# Patient Record
Sex: Female | Born: 1950
Health system: Southern US, Community
[De-identification: ages and names within clinical notes are randomized; demographics above are authoritative.]

## PROBLEM LIST (undated history)

## (undated) DIAGNOSIS — K314 Gastric diverticulum: Secondary | ICD-10-CM

## (undated) DIAGNOSIS — M1990 Unspecified osteoarthritis, unspecified site: Secondary | ICD-10-CM

## (undated) DIAGNOSIS — F419 Anxiety disorder, unspecified: Secondary | ICD-10-CM

## (undated) DIAGNOSIS — L409 Psoriasis, unspecified: Secondary | ICD-10-CM

## (undated) DIAGNOSIS — G4730 Sleep apnea, unspecified: Secondary | ICD-10-CM

## (undated) HISTORY — PX: THUMB ARTHROSCOPY: SHX2509

## (undated) HISTORY — PX: CHOLECYSTECTOMY: SHX55

## (undated) HISTORY — PX: BREAST BIOPSY: SHX20

## (undated) HISTORY — PX: ABDOMINAL HYSTERECTOMY: SHX81

## (undated) HISTORY — PX: BACK SURGERY: SHX140

## (undated) HISTORY — PX: MECKEL DIVERTICULUM EXCISION: SHX314

---

## 2002-08-17 ENCOUNTER — Encounter: Payer: Self-pay | Admitting: Neurosurgery

## 2002-08-17 ENCOUNTER — Inpatient Hospital Stay (HOSPITAL_COMMUNITY): Admission: RE | Admit: 2002-08-17 | Discharge: 2002-08-18 | Payer: Self-pay | Admitting: Neurosurgery

## 2004-05-20 ENCOUNTER — Ambulatory Visit: Payer: Self-pay | Admitting: Unknown Physician Specialty

## 2004-06-07 ENCOUNTER — Ambulatory Visit: Payer: Self-pay | Admitting: Internal Medicine

## 2004-10-09 ENCOUNTER — Encounter: Payer: Self-pay | Admitting: Rheumatology

## 2004-10-10 ENCOUNTER — Encounter: Payer: Self-pay | Admitting: Rheumatology

## 2005-01-07 ENCOUNTER — Ambulatory Visit: Payer: Self-pay | Admitting: Internal Medicine

## 2006-10-23 ENCOUNTER — Ambulatory Visit: Payer: Self-pay | Admitting: Internal Medicine

## 2006-11-04 ENCOUNTER — Ambulatory Visit: Payer: Self-pay | Admitting: Internal Medicine

## 2007-01-01 ENCOUNTER — Ambulatory Visit: Payer: Self-pay | Admitting: Surgery

## 2007-01-01 ENCOUNTER — Other Ambulatory Visit: Payer: Self-pay

## 2007-01-08 ENCOUNTER — Ambulatory Visit: Payer: Self-pay | Admitting: Surgery

## 2007-03-30 ENCOUNTER — Ambulatory Visit: Payer: Self-pay | Admitting: Internal Medicine

## 2007-05-10 ENCOUNTER — Ambulatory Visit: Payer: Self-pay | Admitting: Unknown Physician Specialty

## 2008-08-15 ENCOUNTER — Ambulatory Visit: Payer: Self-pay | Admitting: Internal Medicine

## 2008-08-25 ENCOUNTER — Encounter: Admission: RE | Admit: 2008-08-25 | Discharge: 2008-08-25 | Payer: Self-pay | Admitting: Occupational Medicine

## 2009-04-10 ENCOUNTER — Ambulatory Visit: Payer: Self-pay | Admitting: Orthopedic Surgery

## 2010-06-02 ENCOUNTER — Encounter: Payer: Self-pay | Admitting: Neurosurgery

## 2010-09-27 NOTE — Op Note (Signed)
[name], [name]                           ACCOUNT NO.:  [id number]   MEDICAL RECORD NO.:  [id number]                   PATIENT TYPE:  INP   LOCATION:  2899                                 FACILITY:  [facility]   PHYSICIAN:  Hewitt Shorts, M.D.            DATE OF BIRTH:  [birth year]   DATE OF PROCEDURE:  08/17/2002  DATE OF DISCHARGE:                                 OPERATIVE REPORT   PREOPERATIVE DIAGNOSIS:  C4-5, C5-6 and C6-7 cervical disk herniation,  spondylosis and degenerative disk disease and radiculopathy.   POSTOPERATIVE DIAGNOSIS:  C4-5, C5-6 and C6-7 cervical disk herniation,  spondylosis and degenerative disk disease and radiculopathy.   OPERATION PERFORMED:  C4-5, C5-6 and C6-7 anterior cervical diskectomy and  arthrodesis with VG2 allograft and Premier cervical plating.   SURGEON:  Hewitt Shorts, M.D.   ANESTHESIA:  General endotracheal.   INDICATIONS FOR PROCEDURE:  The patient is a 59 year old woman who presented  with cervical radiculopathy.  He is found to have spondylitic disk  herniations at C4-5, C5-6 and C6-7 superimposed on degenerative disk disease  with resulting radiculopathy.  Decision was made to proceed with three level  anterior cervical diskectomy and arthrodesis.   DESCRIPTION OF PROCEDURE:  The patient was brought to the operating room and  placed under general endotracheal anesthesia.  The patient placed in 10  pounds of halter traction and the neck was prepped with Betadine soap and  solution and draped in a sterile fashion.  An oblique incision was made on  the left side of the neck paralleling the anterior border of the left  sternocleidomastoid.  The line of incision was infiltrated with local  anesthetic with epinephrine.  Incision made and carried down to the  subcutaneous tissue and platysma.  Bipolar cautery was used to maintain  hemostasis.  Dissection was carried out to an avascular plane leaving the  sternocleidomastoid,  carotid artery and jugular vein laterally and trachea  and esophagus medially.  The ventral aspect of the vertebral column was  identified and an x-ray was taken and the C4-5, C5-6 and C6-7 intervertebral  disk spaces were identified.  Diskectomy was begun at each level with  incision of the annulus and continued with microcurets and pituitary  rongeurs.  There was significant anterior osteophytic overgrowth that was  removed.  The operating microscope was draped and brought into the field to  provide additional magnification, illumination and visualization and the  remainder of the diskectomy was performed using microdissection and  microsurgical technique. The cartilaginous end plates and corresponding  vertebrae were removed using microcurets along with the Micromax drill and  then posterior osteophytic overgrowth which was present at each level was  carefully removed using the Micromax drill along with the 2 mm Kerrison  punch with a thin foot plate.  The posterior longitudinal ligament was  spondylitic at each level, thickened and partially calcified and  this was  carefully removed, thereby decompressing the spinal canal and thecal sac.  The foramina were decompressed bilaterally at each level with careful  removal of uncovertebral joint spurring.  We were able to decompress the  foramina and nerve roots within them.  Once the decompression was completed,  hemostasis was established at each level, we soaked VG2 allograft in  antibiotic solution.  We placed 4 mm grafts in the C4-5 and C5-6 levels and  a 5 mm graft at the C6-7 level.  Each graft was carefully countersunk.  We  then selected a 52.5 mm Premier cervical plate.  It was positioned over the  fusion construct and secured to each of the vertebrae with a pair of 4 x 13  mm screws.  Each screw hole was drilled and the screws placed at each level.  They were placed in trapezoidal fashion.  The locking system was secured.  X-  ray  was taken which showed the grafts in good position, the plate and screws  in good position and alignment was good.  The wound was irrigated with  antibiotic solution, checked for hemostasis which was established and  confirmed and then we proceeded with closure.  The platysma was closed with  interrupted inverted 2-0 undyed Vicryl sutures and the subcutaneous and  subcuticular layer closed in 3-0 undyed Vicryl sutures and the skin edges  were approximated with Dermabond.  The patient tolerated the procedure well.  The estimated blood loss for this procedure was 100 ml.  Sponge, needle and  instrument counts were correct.  Following surgery, the patient, who had  been taken out of cervical traction once the bone grafts were in place prior  to the plating was then reversed from anesthetic to be extubated and was  transferred to the recovery room for further care.  Where she was noted to  be moving all four extremities to command.                                                Hewitt Shorts, M.D.    RWN/MEDQ  D:  08/17/2002  T:  08/18/2002  Job:  [id number]

## 2010-10-18 ENCOUNTER — Ambulatory Visit: Payer: Self-pay | Admitting: Internal Medicine

## 2010-10-31 ENCOUNTER — Ambulatory Visit: Payer: Self-pay | Admitting: Internal Medicine

## 2010-11-08 ENCOUNTER — Ambulatory Visit: Payer: Self-pay | Admitting: Internal Medicine

## 2010-11-08 ENCOUNTER — Other Ambulatory Visit: Payer: Self-pay | Admitting: Unknown Physician Specialty

## 2010-11-08 DIAGNOSIS — Z1211 Encounter for screening for malignant neoplasm of colon: Secondary | ICD-10-CM

## 2010-11-25 ENCOUNTER — Ambulatory Visit
Admission: RE | Admit: 2010-11-25 | Discharge: 2010-11-25 | Disposition: A | Discharge status: Home / Self Care | Payer: BC Managed Care – PPO | Source: Ambulatory Visit | Attending: *Deleted | Admitting: *Deleted

## 2010-11-25 DIAGNOSIS — Z1211 Encounter for screening for malignant neoplasm of colon: Secondary | ICD-10-CM

## 2011-02-01 ENCOUNTER — Ambulatory Visit: Payer: Self-pay | Admitting: Orthopedic Surgery

## 2013-05-30 ENCOUNTER — Ambulatory Visit: Payer: Self-pay | Admitting: Internal Medicine

## 2013-11-17 DIAGNOSIS — M1990 Unspecified osteoarthritis, unspecified site: Secondary | ICD-10-CM | POA: Insufficient documentation

## 2014-07-24 ENCOUNTER — Ambulatory Visit: Payer: Self-pay | Admitting: Internal Medicine

## 2014-08-08 ENCOUNTER — Ambulatory Visit: Payer: Self-pay | Admitting: Physical Medicine and Rehabilitation

## 2015-06-28 ENCOUNTER — Other Ambulatory Visit: Payer: Self-pay | Admitting: Internal Medicine

## 2015-06-28 DIAGNOSIS — R221 Localized swelling, mass and lump, neck: Secondary | ICD-10-CM

## 2015-07-06 ENCOUNTER — Ambulatory Visit: Payer: BLUE CROSS/BLUE SHIELD

## 2015-11-26 DIAGNOSIS — Z1329 Encounter for screening for other suspected endocrine disorder: Secondary | ICD-10-CM | POA: Diagnosis not present

## 2015-11-26 DIAGNOSIS — F3341 Major depressive disorder, recurrent, in partial remission: Secondary | ICD-10-CM | POA: Diagnosis not present

## 2015-11-26 DIAGNOSIS — Z1239 Encounter for other screening for malignant neoplasm of breast: Secondary | ICD-10-CM | POA: Diagnosis not present

## 2015-11-26 DIAGNOSIS — Z1211 Encounter for screening for malignant neoplasm of colon: Secondary | ICD-10-CM | POA: Diagnosis not present

## 2015-11-26 DIAGNOSIS — J431 Panlobular emphysema: Secondary | ICD-10-CM | POA: Diagnosis not present

## 2015-11-26 DIAGNOSIS — Z79899 Other long term (current) drug therapy: Secondary | ICD-10-CM | POA: Diagnosis not present

## 2015-11-26 DIAGNOSIS — M810 Age-related osteoporosis without current pathological fracture: Secondary | ICD-10-CM | POA: Diagnosis not present

## 2015-11-26 DIAGNOSIS — Z13220 Encounter for screening for lipoid disorders: Secondary | ICD-10-CM | POA: Diagnosis not present

## 2015-11-26 DIAGNOSIS — Z0000 Encounter for general adult medical examination without abnormal findings: Secondary | ICD-10-CM | POA: Diagnosis not present

## 2015-11-30 ENCOUNTER — Other Ambulatory Visit: Payer: Self-pay | Admitting: Internal Medicine

## 2015-11-30 DIAGNOSIS — Z1231 Encounter for screening mammogram for malignant neoplasm of breast: Secondary | ICD-10-CM

## 2015-12-25 DIAGNOSIS — Z1329 Encounter for screening for other suspected endocrine disorder: Secondary | ICD-10-CM | POA: Diagnosis not present

## 2015-12-25 DIAGNOSIS — Z13220 Encounter for screening for lipoid disorders: Secondary | ICD-10-CM | POA: Diagnosis not present

## 2015-12-25 DIAGNOSIS — Z79899 Other long term (current) drug therapy: Secondary | ICD-10-CM | POA: Diagnosis not present

## 2016-02-06 DIAGNOSIS — Z23 Encounter for immunization: Secondary | ICD-10-CM | POA: Diagnosis not present

## 2016-02-08 ENCOUNTER — Ambulatory Visit
Admission: RE | Admit: 2016-02-08 | Discharge: 2016-02-08 | Disposition: A | Discharge status: Home / Self Care | Payer: PPO | Source: Ambulatory Visit | Attending: Internal Medicine | Admitting: Internal Medicine

## 2016-02-08 DIAGNOSIS — M810 Age-related osteoporosis without current pathological fracture: Secondary | ICD-10-CM | POA: Diagnosis not present

## 2016-02-08 DIAGNOSIS — Z1231 Encounter for screening mammogram for malignant neoplasm of breast: Secondary | ICD-10-CM

## 2016-02-08 DIAGNOSIS — Z1211 Encounter for screening for malignant neoplasm of colon: Secondary | ICD-10-CM | POA: Diagnosis not present

## 2016-02-12 ENCOUNTER — Other Ambulatory Visit: Payer: Self-pay | Admitting: Unknown Physician Specialty

## 2016-02-12 DIAGNOSIS — Q438 Other specified congenital malformations of intestine: Secondary | ICD-10-CM

## 2016-02-22 ENCOUNTER — Other Ambulatory Visit: Payer: PPO

## 2016-02-25 ENCOUNTER — Ambulatory Visit
Admission: RE | Admit: 2016-02-25 | Discharge: 2016-02-25 | Disposition: A | Discharge status: Home / Self Care | Payer: PPO | Source: Ambulatory Visit | Attending: Unknown Physician Specialty | Admitting: Unknown Physician Specialty

## 2016-02-25 DIAGNOSIS — K5732 Diverticulitis of large intestine without perforation or abscess without bleeding: Secondary | ICD-10-CM | POA: Diagnosis not present

## 2016-02-25 DIAGNOSIS — Q438 Other specified congenital malformations of intestine: Secondary | ICD-10-CM

## 2016-06-09 DIAGNOSIS — M7741 Metatarsalgia, right foot: Secondary | ICD-10-CM | POA: Diagnosis not present

## 2016-06-09 DIAGNOSIS — M7742 Metatarsalgia, left foot: Secondary | ICD-10-CM | POA: Diagnosis not present

## 2016-06-09 DIAGNOSIS — M65312 Trigger thumb, left thumb: Secondary | ICD-10-CM | POA: Diagnosis not present

## 2016-06-23 ENCOUNTER — Ambulatory Visit: Payer: PPO

## 2016-06-23 ENCOUNTER — Encounter: Payer: Self-pay | Admitting: Podiatry

## 2016-06-23 ENCOUNTER — Ambulatory Visit (INDEPENDENT_AMBULATORY_CARE_PROVIDER_SITE_OTHER): Payer: PPO | Admitting: Podiatry

## 2016-06-23 VITALS — BP 150/78 | HR 84 | Resp 16 | Ht 65.0 in | Wt 155.0 lb

## 2016-06-23 DIAGNOSIS — D3610 Benign neoplasm of peripheral nerves and autonomic nervous system, unspecified: Secondary | ICD-10-CM | POA: Diagnosis not present

## 2016-06-23 DIAGNOSIS — M79672 Pain in left foot: Secondary | ICD-10-CM

## 2016-06-23 DIAGNOSIS — M779 Enthesopathy, unspecified: Secondary | ICD-10-CM | POA: Diagnosis not present

## 2016-06-23 NOTE — Progress Notes (Signed)
   Subjective:    Patient ID: [name], female    DOB: 30-Dec-1950, 66 y.o.   MRN: [id number]  HPI Chief Complaint  Patient presents with  . Foot Injury    Right foot; Right foot; lateral side & plantar forefoot; pt stated, "About 8 months ago, fell on foot when walked down the steps and twisted foot; can feel popping sound in ball of foot; sometimes feels a burning sensation in ball of foot; pain is on and off"      Review of Systems  All other systems reviewed and are negative.      Objective:   Physical Exam        Assessment & Plan:

## 2016-06-23 NOTE — Patient Instructions (Signed)
Morton Neuralgia Introduction Morton neuralgia is a type of foot pain in the area closest to your toes. This area is sometimes called the ball of your foot. Morton neuralgia occurs when a branch of a nerve in your foot (digital nerve) becomes compressed. When this happens over a long period of time, the nerve can thicken (neuroma) and cause pain. This usually occurs between the third and fourth toe. Morton neuralgia can come and go but may get worse over time. What are the causes? Your digital nerve can become compressed and stretched at a point where it passes under a thick band of tissue that connects your toes (intermetatarsal ligament). Morton neuralgia can be caused by mild repetitive damage in this area. This type of damage can result from:  Activities such as running or jumping.  Wearing shoes that are too tight. What increases the risk? You may be at risk for Morton neuralgia if you:  Are female.  Wear high heels.  Wear shoes that are narrow or tight.  Participate in activities that stretch your toes. These include:  Running.  Ballet.  Long-distance walking. What are the signs or symptoms? The first symptom of Morton neuralgia is pain that spreads from the ball of your foot to your toes. It may feel like you are walking on a marble. Pain usually gets worse with walking and goes away at night. Other symptoms may include numbness and cramping of your toes. How is this diagnosed? Your health care provider will do a physical exam. When doing the exam, your health care provider may:  Squeeze your foot just behind your toe.  Ask you to move your toes to check for pain. You may also have tests on your foot to confirm the diagnosis. These may include:  An X-ray.  An MRI. How is this treated? Treatment for Morton neuralgia may be as simple as changing the kind of shoes you wear. Other treatments may include:  Wearing a supportive pad (orthosis) under the front of your foot.  This lifts your toe bones and takes pressure off the nerve.  Getting injections of numbing medicine and anti-inflammatory medicine (steroid) in the nerve.  Having surgery to remove part of the thickened nerve. Follow these instructions at home:  Take medicine only as directed by your health care provider.  Wear soft-soled shoes with a wide toe area.  Stop activities that may be causing pain.  Elevate your foot when resting.  Massage your foot.  Apply ice to the injured area:  Put ice in a plastic bag.  Place a towel between your skin and the bag.  Leave the ice on for 20 minutes, 2-3 times a day.  Keep all follow-up visits as directed by your health care provider. This is important. Contact a health care provider if:  Home care instructions are not helping you get better.  Your symptoms change or get worse. This information is not intended to replace advice given to you by your health care provider. Make sure you discuss any questions you have with your health care provider. Document Released: 08/04/2000 Document Revised: 10/04/2015 Document Reviewed: 06/29/2013  2017 [employer]  

## 2016-06-25 NOTE — Progress Notes (Signed)
Subjective:     Patient ID: [name], female   DOB: 08/01/1950, 66 y.o.   MRN: [id number]  HPI patient states that she feels like she fell on her right foot twisted it and developed a burning pain in the forefoot with a feeling like there is a pop or a not in the area. Has had x-rays so far which are negative   Review of Systems  All other systems reviewed and are negative.      Objective:   Physical Exam  Constitutional: She is oriented to person, place, and time.  Cardiovascular: Intact distal pulses.   Musculoskeletal: Normal range of motion.  Neurological: She is oriented to person, place, and time.  Skin: Skin is warm.  Nursing note and vitals reviewed.  neurovascular status intact muscle strength adequate range of motion within normal limits with patient found to have what appears to be a positive nodule third interspace right with shooting pains and positive Mulder sign. Mild discomfort on the outside of the foot but no indications of bony stress or pathology. Good digital perfusion well oriented 3     Assessment:     Probability for neuroma symptomatology right    Plan:     H&P condition and explanation given to patient. Today I did a sterile prep and I injected directly into the nerve root prior to breaking into digital branches with a purified alcohol Marcaine solution for sclerosing of the nerve with the possibility for excision in future. Reappoint in 2 weeks to reevaluate

## 2016-07-09 ENCOUNTER — Encounter: Payer: Self-pay | Admitting: Podiatry

## 2016-07-09 ENCOUNTER — Ambulatory Visit (INDEPENDENT_AMBULATORY_CARE_PROVIDER_SITE_OTHER): Payer: PPO | Admitting: Podiatry

## 2016-07-09 DIAGNOSIS — D3610 Benign neoplasm of peripheral nerves and autonomic nervous system, unspecified: Secondary | ICD-10-CM | POA: Diagnosis not present

## 2016-07-09 NOTE — Progress Notes (Signed)
Subjective:     Patient ID: [name], female   DOB: 1950/07/28, 66 y.o.   MRN: [id number]  HPI patient states she has shooting pain in the third interspace right with some improvement but still present and had significant improvement the first day   Review of Systems     Objective:   Physical Exam Neurovascular status intact with patient found to have shooting pain third interspace right with positive Biagio Borg sign    Assessment:     Neuroma symptomatology right present    Plan:     Explained improvement and hopefully we will get continued improvement and today I did sterile prep of the right forefoot injected directly into the nerve root with a purified alcohol Marcaine solution and reappoint 3 weeks

## 2016-08-06 ENCOUNTER — Ambulatory Visit (INDEPENDENT_AMBULATORY_CARE_PROVIDER_SITE_OTHER): Payer: PPO | Admitting: Podiatry

## 2016-08-06 DIAGNOSIS — D3610 Benign neoplasm of peripheral nerves and autonomic nervous system, unspecified: Secondary | ICD-10-CM | POA: Diagnosis not present

## 2016-08-07 NOTE — Progress Notes (Signed)
Subjective:     Patient ID: [name], female   DOB: 09-28-1950, 66 y.o.   MRN: [id number]  HPI patient states she seems to be improving but still has a feeling of popping between the toes and occasional discomfort   Review of Systems     Objective:   Physical Exam Neurovascular status was intact with patient found have a positive Mulder sign third interspace right foot with a palpable mass with continued discomfort but quite a bit improved from previous    Assessment:     Neuroma symptomatology which appears to be responding at this time to sclerosing agent    Plan:     We will try 1 more sclerosing agent and understands eventually it still may require resection. I did a sterile prep of the area I did injected into the third interspace with a air 5 alcohol Marcaine solution 1.5 mL which was tolerated and sterile dressing applied. Patient will be seen back depending on symptoms

## 2016-09-03 DIAGNOSIS — R197 Diarrhea, unspecified: Secondary | ICD-10-CM | POA: Diagnosis not present

## 2016-10-20 DIAGNOSIS — M65312 Trigger thumb, left thumb: Secondary | ICD-10-CM | POA: Diagnosis not present

## 2016-10-30 ENCOUNTER — Other Ambulatory Visit: Payer: Self-pay | Admitting: Orthopedic Surgery

## 2016-10-30 DIAGNOSIS — M65312 Trigger thumb, left thumb: Secondary | ICD-10-CM | POA: Diagnosis not present

## 2016-11-04 DIAGNOSIS — L309 Dermatitis, unspecified: Secondary | ICD-10-CM | POA: Diagnosis not present

## 2016-11-07 ENCOUNTER — Encounter (HOSPITAL_BASED_OUTPATIENT_CLINIC_OR_DEPARTMENT_OTHER): Payer: Self-pay | Admitting: *Deleted

## 2016-11-13 NOTE — H&P (Addendum)
[name] is an 66 y.o. female.   CC / Reason for Visit: Left thumb trigger HPI: This patient is a 66 year old, right-hand-dominant, female who indicates that she has had a trigger thumb for 6-7 months which is much worse in the morning and actively becomes stuck in flexion.  She indicates that she has had 2 injections into the tendon sheath by Dr. Noemi Chapel with the last being in January of this year.  She went to see him again on 10/20/2016 and he recommended that she see [place] for discussion of left trigger thumb release.  She has had a history of bilateral TMC arthroplasties by Dr. Burney Gauze and denies any history of rheumatoid or autoimmune disorders.   Past Medical History:  Diagnosis Date  . Anxiety   . Arthritis   . Diverticulosis, gastric   . Psoriasis   . Sleep apnea    cpap    Past Surgical History:  Procedure Laterality Date  . ABDOMINAL HYSTERECTOMY    . BACK SURGERY     plate at c4  . BREAST BIOPSY Right 1990's   benign  . CESAREAN SECTION     x2  . CHOLECYSTECTOMY    . MECKEL DIVERTICULUM EXCISION    . THUMB ARTHROSCOPY      Family History  Problem Relation Age of Onset  . Breast cancer Mother 55   Social History:  reports that she has been smoking Cigarettes.  She has been smoking about 0.25 packs per day. She has never used smokeless tobacco. She reports that she does not drink alcohol or use drugs.  Allergies: No Known Allergies  No prescriptions prior to admission.    No results found for this or any previous visit (from the past 48 hour(s)). No results found.  Review of Systems  All other systems reviewed and are negative.   Height 5\' 5"  (1.651 m), weight 69.4 kg (153 lb). Physical Exam  Constitutional:  WD, WN, NAD HEENT:  NCAT, EOMI Neuro/Psych:  Alert & oriented to person, place, and time; appropriate mood & affect Lymphatic: No generalized UE edema or lymphadenopathy Extremities / MSK:  Both UE are normal with respect to appearance, ranges of  motion, joint stability, muscle strength/tone, sensation, & perfusion except as otherwise noted:  The left thumb is somewhat swollen at the volar base with painful palpation about the A1 pulley.  There is palpable catching through the flexion cycle.  The digit is able to flex completely at the IP although it is quite painful.  NVI.  Labs / Xrays:  No radiographic studies obtained today.  Assessment: Left thumb stenosing tenosynovitis  Plan:  The findings were discussed with the patient.  She wishes to move forward with left thumb trigger thumb release. The details of the operative procedure were discussed with the patient.  Questions were invited and answered.  In addition to the goal of the procedure, the risks of the procedure to include but not limited to bleeding; infection; damage to the nerves or blood vessels that could result in bleeding, numbness, weakness, chronic pain, and the need for additional procedures; stiffness; the need for revision surgery; and anesthetic risks were reviewed.  No specific outcome was guaranteed or implied.  Informed consent was obtained.   Raymund Manrique A., MD 11/13/2016, 5:37 PM

## 2016-11-17 ENCOUNTER — Ambulatory Visit (HOSPITAL_BASED_OUTPATIENT_CLINIC_OR_DEPARTMENT_OTHER): Payer: PPO | Admitting: Anesthesiology

## 2016-11-17 ENCOUNTER — Encounter (HOSPITAL_BASED_OUTPATIENT_CLINIC_OR_DEPARTMENT_OTHER)
Admission: RE | Disposition: A | Discharge status: Home / Self Care | Payer: Self-pay | Source: Ambulatory Visit | Attending: Orthopedic Surgery

## 2016-11-17 ENCOUNTER — Ambulatory Visit (HOSPITAL_BASED_OUTPATIENT_CLINIC_OR_DEPARTMENT_OTHER)
Admission: RE | Admit: 2016-11-17 | Discharge: 2016-11-17 | Disposition: A | Discharge status: Home / Self Care | Payer: PPO | Source: Ambulatory Visit | Attending: Orthopedic Surgery | Admitting: Orthopedic Surgery

## 2016-11-17 ENCOUNTER — Encounter (HOSPITAL_BASED_OUTPATIENT_CLINIC_OR_DEPARTMENT_OTHER): Payer: Self-pay | Admitting: *Deleted

## 2016-11-17 DIAGNOSIS — G4730 Sleep apnea, unspecified: Secondary | ICD-10-CM | POA: Diagnosis not present

## 2016-11-17 DIAGNOSIS — F17210 Nicotine dependence, cigarettes, uncomplicated: Secondary | ICD-10-CM | POA: Insufficient documentation

## 2016-11-17 DIAGNOSIS — Z9989 Dependence on other enabling machines and devices: Secondary | ICD-10-CM | POA: Diagnosis not present

## 2016-11-17 DIAGNOSIS — M65312 Trigger thumb, left thumb: Secondary | ICD-10-CM | POA: Insufficient documentation

## 2016-11-17 HISTORY — DX: Sleep apnea, unspecified: G47.30

## 2016-11-17 HISTORY — DX: Anxiety disorder, unspecified: F41.9

## 2016-11-17 HISTORY — PX: TRIGGER FINGER RELEASE: SHX641

## 2016-11-17 HISTORY — DX: Psoriasis, unspecified: L40.9

## 2016-11-17 HISTORY — DX: Unspecified osteoarthritis, unspecified site: M19.90

## 2016-11-17 HISTORY — DX: Gastric diverticulum: K31.4

## 2016-11-17 SURGERY — RELEASE, A1 PULLEY, FOR TRIGGER FINGER
Anesthesia: Monitor Anesthesia Care | Site: Thumb | Laterality: Left

## 2016-11-17 MED ORDER — CEFAZOLIN SODIUM-DEXTROSE 2-4 GM/100ML-% IV SOLN
INTRAVENOUS | Status: AC
  Filled 2016-11-17: qty 100

## 2016-11-17 MED ORDER — OXYCODONE HCL 5 MG/5ML PO SOLN: 5.0000 mg | Freq: Once | ORAL | Status: DC | PRN

## 2016-11-17 MED ORDER — MIDAZOLAM HCL 2 MG/2ML IJ SOLN
INTRAMUSCULAR | Status: AC
  Filled 2016-11-17: qty 2

## 2016-11-17 MED ORDER — SCOPOLAMINE 1 MG/3DAYS TD PT72: 1.0000 | MEDICATED_PATCH | Freq: Once | TRANSDERMAL | Status: DC | PRN

## 2016-11-17 MED ORDER — OXYCODONE HCL 5 MG PO TABS: 5.0000 mg | ORAL_TABLET | Freq: Four times a day (QID) | ORAL | 0 refills | Status: DC | PRN

## 2016-11-17 MED ORDER — LIDOCAINE HCL 2 % IJ SOLN
INTRAMUSCULAR | Status: DC | PRN
  Administered 2016-11-17: 4 mL

## 2016-11-17 MED ORDER — LACTATED RINGERS IV SOLN: INTRAVENOUS | Status: DC

## 2016-11-17 MED ORDER — BUPIVACAINE-EPINEPHRINE 0.5% -1:200000 IJ SOLN
INTRAMUSCULAR | Status: DC | PRN
  Administered 2016-11-17: 4 mL

## 2016-11-17 MED ORDER — IBUPROFEN 200 MG PO TABS: 600.0000 mg | ORAL_TABLET | Freq: Four times a day (QID) | ORAL | Status: AC | PRN

## 2016-11-17 MED ORDER — CEFAZOLIN SODIUM-DEXTROSE 2-4 GM/100ML-% IV SOLN
2.0000 g | INTRAVENOUS | Status: AC
  Administered 2016-11-17: 2 g via INTRAVENOUS

## 2016-11-17 MED ORDER — PROPOFOL 500 MG/50ML IV EMUL
INTRAVENOUS | Status: DC | PRN
  Administered 2016-11-17: 75 ug/kg/min via INTRAVENOUS

## 2016-11-17 MED ORDER — LACTATED RINGERS IV SOLN
INTRAVENOUS | Status: DC
  Administered 2016-11-17: 10:00:00 via INTRAVENOUS

## 2016-11-17 MED ORDER — OXYCODONE HCL 5 MG PO TABS: 5.0000 mg | ORAL_TABLET | Freq: Once | ORAL | Status: DC | PRN

## 2016-11-17 MED ORDER — FENTANYL CITRATE (PF) 100 MCG/2ML IJ SOLN
50.0000 ug | INTRAMUSCULAR | Status: DC | PRN
  Administered 2016-11-17: 50 ug via INTRAVENOUS

## 2016-11-17 MED ORDER — FENTANYL CITRATE (PF) 100 MCG/2ML IJ SOLN
INTRAMUSCULAR | Status: AC
  Filled 2016-11-17: qty 2

## 2016-11-17 MED ORDER — ONDANSETRON HCL 4 MG/2ML IJ SOLN: 4.0000 mg | Freq: Four times a day (QID) | INTRAMUSCULAR | Status: DC | PRN

## 2016-11-17 MED ORDER — LIDOCAINE 2% (20 MG/ML) 5 ML SYRINGE
INTRAMUSCULAR | Status: DC | PRN
  Administered 2016-11-17: 60 mg via INTRAVENOUS

## 2016-11-17 MED ORDER — ACETAMINOPHEN 325 MG PO TABS: 650.0000 mg | ORAL_TABLET | Freq: Four times a day (QID) | ORAL | Status: AC | PRN

## 2016-11-17 MED ORDER — FENTANYL CITRATE (PF) 100 MCG/2ML IJ SOLN: 25.0000 ug | INTRAMUSCULAR | Status: DC | PRN

## 2016-11-17 MED ORDER — ONDANSETRON HCL 4 MG/2ML IJ SOLN
INTRAMUSCULAR | Status: DC | PRN
  Administered 2016-11-17: 4 mg via INTRAVENOUS

## 2016-11-17 MED ORDER — MIDAZOLAM HCL 2 MG/2ML IJ SOLN
1.0000 mg | INTRAMUSCULAR | Status: DC | PRN
  Administered 2016-11-17: 1 mg via INTRAVENOUS

## 2016-11-17 SURGICAL SUPPLY — 36 items
BLADE SURG 15 STRL LF DISP TIS (BLADE) ×1 IMPLANT
BLADE SURG 15 STRL SS (BLADE) ×1
BNDG COHESIVE 1X5 TAN STRL LF (GAUZE/BANDAGES/DRESSINGS) ×2 IMPLANT
BNDG CONFORM 2 STRL LF (GAUZE/BANDAGES/DRESSINGS) ×2 IMPLANT
BNDG ESMARK 4X9 LF (GAUZE/BANDAGES/DRESSINGS) ×2 IMPLANT
BNDG GAUZE ELAST 4 BULKY (GAUZE/BANDAGES/DRESSINGS) ×2 IMPLANT
CHLORAPREP W/TINT 26ML (MISCELLANEOUS) ×2 IMPLANT
COVER BACK TABLE 60X90IN (DRAPES) ×2 IMPLANT
COVER MAYO STAND STRL (DRAPES) ×2 IMPLANT
CUFF TOURNIQUET SINGLE 18IN (TOURNIQUET CUFF) ×2 IMPLANT
DRAPE EXTREMITY T 121X128X90 (DRAPE) ×2 IMPLANT
DRAPE SURG 17X23 STRL (DRAPES) ×2 IMPLANT
DRSG EMULSION OIL 3X3 NADH (GAUZE/BANDAGES/DRESSINGS) ×2 IMPLANT
GAUZE SPONGE 4X4 12PLY STRL LF (GAUZE/BANDAGES/DRESSINGS) ×2 IMPLANT
GLOVE BIO SURGEON STRL SZ7.5 (GLOVE) ×2 IMPLANT
GLOVE BIOGEL PI IND STRL 7.0 (GLOVE) ×2 IMPLANT
GLOVE BIOGEL PI IND STRL 8 (GLOVE) ×2 IMPLANT
GLOVE BIOGEL PI INDICATOR 7.0 (GLOVE) ×2
GLOVE BIOGEL PI INDICATOR 8 (GLOVE) ×2
GLOVE ECLIPSE 6.5 STRL STRAW (GLOVE) ×4 IMPLANT
GOWN STRL REUS W/ TWL LRG LVL3 (GOWN DISPOSABLE) ×2 IMPLANT
GOWN STRL REUS W/TWL LRG LVL3 (GOWN DISPOSABLE) ×2
GOWN STRL REUS W/TWL XL LVL3 (GOWN DISPOSABLE) ×2 IMPLANT
NDL SAFETY ECLIPSE 18X1.5 (NEEDLE) ×1 IMPLANT
NEEDLE HYPO 18GX1.5 SHARP (NEEDLE) ×1
NEEDLE HYPO 25X1 1.5 SAFETY (NEEDLE) ×2 IMPLANT
NS IRRIG 1000ML POUR BTL (IV SOLUTION) ×2 IMPLANT
PACK BASIN DAY SURGERY FS (CUSTOM PROCEDURE TRAY) ×2 IMPLANT
STOCKINETTE 6  STRL (DRAPES) ×1
STOCKINETTE 6 STRL (DRAPES) ×1 IMPLANT
SUT VICRYL RAPIDE 4/0 PS 2 (SUTURE) ×2 IMPLANT
SYR 10ML LL (SYRINGE) ×2 IMPLANT
SYR BULB 3OZ (MISCELLANEOUS) ×2 IMPLANT
TOWEL OR 17X24 6PK STRL BLUE (TOWEL DISPOSABLE) ×2 IMPLANT
TOWEL OR NON WOVEN STRL DISP B (DISPOSABLE) IMPLANT
UNDERPAD 30X30 (UNDERPADS AND DIAPERS) ×2 IMPLANT

## 2016-11-17 NOTE — Transfer of Care (Signed)
Immediate Anesthesia Transfer of Care Note  Patient: [name]  Procedure(s) Performed: Procedure(s): LEFT TRIGGER THUMB RELEASE (Left)  Patient Location: PACU  Anesthesia Type:MAC  Level of Consciousness: awake, alert , oriented and patient cooperative  Airway & Oxygen Therapy: Patient Spontanous Breathing and Patient connected to face mask oxygen  Post-op Assessment: Report given to RN and Post -op Vital signs reviewed and stable  Post vital signs: Reviewed and stable  Last Vitals:  Vitals:   11/17/16 0956  BP: 139/74  Pulse: 76  Resp: 18  Temp: 36.6 C    Last Pain:  Vitals:   11/17/16 0956  TempSrc: Oral         Complications: No apparent anesthesia complications

## 2016-11-17 NOTE — Discharge Instructions (Addendum)
Discharge Instructions   You have a light dressing on your hand.  You may begin gentle motion of your fingers and hand immediately, but you should not do any heavy lifting or gripping.  Elevate your hand to reduce pain & swelling of the digits.  Ice over the operative site may be helpful to reduce pain & swelling.  DO NOT USE HEAT. Leave the dressing in place until the third day after your surgery and then remove it, leaving it open to air.  After the bandage has been removed you may shower, regularly washing the incision and letting the water run over it, but not submerging it (no swimming, soaking it in dishwater, etc.) You may drive a car when you are off of prescription pain medications and can safely control your vehicle with both hands. We will address whether therapy will be required or not when you return to the office. You may have already made your follow-up appointment when we completed your preop visit.  If not, please call our office today or the next business day to make your return appointment for 7-10 days after surgery.   Please call [phone] during normal business hours or [phone] after hours for any problems. Including the following:  - excessive redness of the incisions - drainage for more than 4 days - fever of more than 101.5 F  *Please note that pain medications will not be refilled after hours or on weekends.   Post Anesthesia Home Care Instructions  Activity: Get plenty of rest for the remainder of the day. A responsible individual must stay with you for 24 hours following the procedure.  For the next 24 hours, DO NOT: -Drive a car -Paediatric nurse -Drink alcoholic beverages -Take any medication unless instructed by your physician -Make any legal decisions or sign important papers.  Meals: Start with liquid foods such as gelatin or soup. Progress to regular foods as tolerated. Avoid greasy, spicy, heavy foods. If nausea and/or vomiting occur, drink  only clear liquids until the nausea and/or vomiting subsides. Call your physician if vomiting continues.  Special Instructions/Symptoms: Your throat may feel dry or sore from the anesthesia or the breathing tube placed in your throat during surgery. If this causes discomfort, gargle with warm salt water. The discomfort should disappear within 24 hours.  If you had a scopolamine patch placed behind your ear for the management of post- operative nausea and/or vomiting:  1. The medication in the patch is effective for 72 hours, after which it should be removed.  Wrap patch in a tissue and discard in the trash. Wash hands thoroughly with soap and water. 2. You may remove the patch earlier than 72 hours if you experience unpleasant side effects which may include dry mouth, dizziness or visual disturbances. 3. Avoid touching the patch. Wash your hands with soap and water after contact with the patch.     Regional Anesthesia Blocks  1. Numbness or the inability to move the "blocked" extremity may last from 3-48 hours after placement. The length of time depends on the medication injected and your individual response to the medication. If the numbness is not going away after 48 hours, call your surgeon.  2. The extremity that is blocked will need to be protected until the numbness is gone and the  Strength has returned. Because you cannot feel it, you will need to take extra care to avoid injury. Because it may be weak, you may have difficulty moving it or using it. You  may not know what position it is in without looking at it while the block is in effect.  3. For blocks in the legs and feet, returning to weight bearing and walking needs to be done carefully. You will need to wait until the numbness is entirely gone and the strength has returned. You should be able to move your leg and foot normally before you try and bear weight or walk. You will need someone to be with you when you first try to ensure  you do not fall and possibly risk injury.  4. Bruising and tenderness at the needle site are common side effects and will resolve in a few days.  5. Persistent numbness or new problems with movement should be communicated to the surgeon or the [place] [id number] [place] [phone]).

## 2016-11-17 NOTE — Interval H&P Note (Signed)
History and Physical Interval Note:  11/17/2016 10:29 AM  [name]  has presented today for surgery, with the diagnosis of LEFT TRIGGER THUMB M65.312  The various methods of treatment have been discussed with the patient and family. After consideration of risks, benefits and other options for treatment, the patient has consented to  Procedure(s): LEFT TRIGGER THUMB RELEASE (Left) as a surgical intervention .  The patient's history has been reviewed, patient examined, no change in status, stable for surgery.  I have reviewed the patient's chart and labs.  Questions were answered to the patient's satisfaction.     Ermon Sagan A.

## 2016-11-17 NOTE — Interval H&P Note (Signed)
History and Physical Interval Note:  11/17/2016 9:40 AM  [name]  has presented today for surgery, with the diagnosis of LEFT TRIGGER THUMB M65.312  The various methods of treatment have been discussed with the patient and family. After consideration of risks, benefits and other options for treatment, the patient has consented to  Procedure(s): LEFT TRIGGER THUMB RELEASE (Left) as a surgical intervention .  The patient's history has been reviewed, patient examined, no change in status, stable for surgery.  I have reviewed the patient's chart and labs.  Questions were answered to the patient's satisfaction.     Adela Esteban A.

## 2016-11-17 NOTE — Anesthesia Postprocedure Evaluation (Signed)
Anesthesia Post Note  Patient: [name]  Procedure(s) Performed: Procedure(s) (LRB): LEFT TRIGGER THUMB RELEASE (Left)     Patient location during evaluation: PACU Anesthesia Type: MAC Level of consciousness: awake and alert Pain management: pain level controlled Vital Signs Assessment: post-procedure vital signs reviewed and stable Respiratory status: spontaneous breathing, nonlabored ventilation, respiratory function stable and patient connected to nasal cannula oxygen Cardiovascular status: stable and blood pressure returned to baseline Anesthetic complications: no    Last Vitals:  Vitals:   11/17/16 1130 11/17/16 1200  BP: (!) 148/71 (!) 148/65  Pulse: 68 61  Resp: 16 16  Temp:  36.4 C    Last Pain:  Vitals:   11/17/16 1200  TempSrc: Oral  PainSc: 0-No pain                 Ayden Hardwick

## 2016-11-17 NOTE — Op Note (Signed)
11/17/2016  10:29 AM  PATIENT:  [name]  66 y.o. female  PRE-OPERATIVE DIAGNOSIS:  LEFT TRIGGER THUMB M65.312  POST-OPERATIVE DIAGNOSIS:  Same  PROCEDURE:  Procedure(s): LEFT TRIGGER THUMB RELEASE  SURGEON:  Surgeon(s): Milly Jakob, MD  PHYSICIAN ASSISTANT: None  ANESTHESIA:  Local / MAC  SPECIMENS:  None  DRAINS:   None  EBL:  less than 50 mL  PREOPERATIVE INDICATIONS:  [name] is a  66 y.o. female with a diagnosis of LEFT TRIGGER THUMB M65.312 , possibly with a large retinacular cyst, who failed conservative measures and elected for surgical management.    The risks benefits and alternatives were discussed with the patient preoperatively including but not limited to the risks of infection, bleeding, nerve injury, cardiopulmonary complications, the need for revision surgery, among others, and the patient verbalized understanding and consented to proceed.  OPERATIVE IMPLANTS: None  OPERATIVE FINDINGS: See Below  OPERATIVE PROCEDURE:  After receiving prophylactic antibiotics, the patient was escorted to the operative theatre and placed in a supine position.   A surgical "time-out" was performed during which the planned procedure, proposed operative site, and the correct patient identity were compared to the operative consent and agreement confirmed by the circulating nurse according to current facility policy.  Digital block was performed with a mixture of lidocaine and Marcaine, bearing epinephrine. Following application of a tourniquet to the operative extremity, the exposed skin was prepped with Chloraprep and draped in the usual sterile fashion.  The limb was exsanguinated with an Esmarch bandage and the tourniquet inflated to approximately 135mHg higher than systolic BP.  2 limb zigzag incision was made, centered over the prominence of the palpable volar enlargement.  Subcutaneous taste tissues were dissected with blunt and spreading dissection to reveal an  underlying flexor tendon sheath and A1 pulley.  With the neurovascular structures protected, the A1 pulley was split in the midline under direct visualization with loupe assistance.  Some crossing bands proximal to the A1 pulley were also released.  The palpable enlargement was a little bit of thickened and scarred subcutaneous tissues volar to the sheath, but largely was just deformation of the joint with hyperextension at the MP joint.   The tendon was pulled into view, cleaned of thickened synovium and return to their bed.  The wound was irrigated, tourniquet released, and skin closed with 4-0 Vicryl Rapide.  A light dressing was applied and the patient was taken to the recovery room.  DISPOSITION: The patient will be discharged home today with typical instructions, returning in 7-10 days.

## 2016-11-17 NOTE — Interval H&P Note (Signed)
History and Physical Interval Note:  11/17/2016 9:39 AM  [name]  has presented today for surgery, with the diagnosis of LEFT TRIGGER THUMB M65.312  The various methods of treatment have been discussed with the patient and family. After consideration of risks, benefits and other options for treatment, the patient has consented to  Procedure(s): LEFT TRIGGER THUMB RELEASE (Left) as a surgical intervention .  The patient's history has been reviewed, patient examined, no change in status, stable for surgery.  I have reviewed the patient's chart and labs.  Questions were answered to the patient's satisfaction.     [name] A.

## 2016-11-17 NOTE — Anesthesia Preprocedure Evaluation (Signed)
Anesthesia Evaluation  Patient identified by MRN, date of birth, ID band Patient awake    Reviewed: Allergy & Precautions, H&P , NPO status , Patient's Chart, lab work & pertinent test results  Airway Mallampati: II   Neck ROM: full    Dental   Pulmonary sleep apnea , Current Smoker,    breath sounds clear to auscultation       Cardiovascular negative cardio ROS   Rhythm:regular Rate:Normal     Neuro/Psych PSYCHIATRIC DISORDERS Anxiety    GI/Hepatic   Endo/Other    Renal/GU      Musculoskeletal  (+) Arthritis ,   Abdominal   Peds  Hematology   Anesthesia Other Findings   Reproductive/Obstetrics                             Anesthesia Physical Anesthesia Plan  ASA: II  Anesthesia Plan: MAC   Post-op Pain Management:    Induction: Intravenous  PONV Risk Score and Plan: 2 and Ondansetron, Dexamethasone and Treatment may vary due to age or medical condition  Airway Management Planned: Simple Face Mask  Additional Equipment:   Intra-op Plan:   Post-operative Plan:   Informed Consent: I have reviewed the patients History and Physical, chart, labs and discussed the procedure including the risks, benefits and alternatives for the proposed anesthesia with the patient or authorized representative who has indicated his/her understanding and acceptance.     Plan Discussed with: CRNA, Anesthesiologist and Surgeon  Anesthesia Plan Comments:         Anesthesia Quick Evaluation

## 2016-11-18 ENCOUNTER — Encounter (HOSPITAL_BASED_OUTPATIENT_CLINIC_OR_DEPARTMENT_OTHER): Payer: Self-pay | Admitting: Orthopedic Surgery

## 2016-12-17 DIAGNOSIS — J449 Chronic obstructive pulmonary disease, unspecified: Secondary | ICD-10-CM | POA: Diagnosis not present

## 2016-12-17 DIAGNOSIS — Z0000 Encounter for general adult medical examination without abnormal findings: Secondary | ICD-10-CM | POA: Diagnosis not present

## 2016-12-17 DIAGNOSIS — J431 Panlobular emphysema: Secondary | ICD-10-CM | POA: Diagnosis not present

## 2016-12-17 DIAGNOSIS — Z1159 Encounter for screening for other viral diseases: Secondary | ICD-10-CM | POA: Diagnosis not present

## 2016-12-17 DIAGNOSIS — M810 Age-related osteoporosis without current pathological fracture: Secondary | ICD-10-CM | POA: Diagnosis not present

## 2016-12-17 DIAGNOSIS — Z1231 Encounter for screening mammogram for malignant neoplasm of breast: Secondary | ICD-10-CM | POA: Diagnosis not present

## 2016-12-17 DIAGNOSIS — E7800 Pure hypercholesterolemia, unspecified: Secondary | ICD-10-CM | POA: Diagnosis not present

## 2016-12-17 DIAGNOSIS — M1990 Unspecified osteoarthritis, unspecified site: Secondary | ICD-10-CM | POA: Diagnosis not present

## 2016-12-17 DIAGNOSIS — M5030 Other cervical disc degeneration, unspecified cervical region: Secondary | ICD-10-CM | POA: Diagnosis not present

## 2016-12-17 DIAGNOSIS — G4733 Obstructive sleep apnea (adult) (pediatric): Secondary | ICD-10-CM | POA: Diagnosis not present

## 2016-12-17 DIAGNOSIS — Z23 Encounter for immunization: Secondary | ICD-10-CM | POA: Diagnosis not present

## 2016-12-17 DIAGNOSIS — Z79899 Other long term (current) drug therapy: Secondary | ICD-10-CM | POA: Diagnosis not present

## 2016-12-17 DIAGNOSIS — F3341 Major depressive disorder, recurrent, in partial remission: Secondary | ICD-10-CM | POA: Diagnosis not present

## 2016-12-18 ENCOUNTER — Other Ambulatory Visit: Payer: Self-pay | Admitting: Internal Medicine

## 2016-12-18 DIAGNOSIS — R911 Solitary pulmonary nodule: Secondary | ICD-10-CM

## 2016-12-25 ENCOUNTER — Ambulatory Visit
Admission: RE | Admit: 2016-12-25 | Discharge: 2016-12-25 | Disposition: A | Discharge status: Home / Self Care | Payer: PPO | Source: Ambulatory Visit | Attending: Internal Medicine | Admitting: Internal Medicine

## 2016-12-25 DIAGNOSIS — J439 Emphysema, unspecified: Secondary | ICD-10-CM | POA: Insufficient documentation

## 2016-12-25 DIAGNOSIS — R918 Other nonspecific abnormal finding of lung field: Secondary | ICD-10-CM | POA: Diagnosis not present

## 2016-12-25 DIAGNOSIS — R911 Solitary pulmonary nodule: Secondary | ICD-10-CM | POA: Insufficient documentation

## 2017-01-09 ENCOUNTER — Other Ambulatory Visit: Payer: Self-pay | Admitting: Internal Medicine

## 2017-01-09 DIAGNOSIS — Z1231 Encounter for screening mammogram for malignant neoplasm of breast: Secondary | ICD-10-CM

## 2017-02-06 DIAGNOSIS — Z23 Encounter for immunization: Secondary | ICD-10-CM | POA: Diagnosis not present

## 2017-02-09 ENCOUNTER — Ambulatory Visit
Admission: RE | Admit: 2017-02-09 | Discharge: 2017-02-09 | Disposition: A | Discharge status: Home / Self Care | Payer: PPO | Source: Ambulatory Visit | Attending: Internal Medicine | Admitting: Internal Medicine

## 2017-02-09 DIAGNOSIS — Z1231 Encounter for screening mammogram for malignant neoplasm of breast: Secondary | ICD-10-CM | POA: Diagnosis not present

## 2017-06-29 DIAGNOSIS — H524 Presbyopia: Secondary | ICD-10-CM | POA: Diagnosis not present

## 2018-01-04 DIAGNOSIS — E7800 Pure hypercholesterolemia, unspecified: Secondary | ICD-10-CM | POA: Diagnosis not present

## 2018-01-04 DIAGNOSIS — Z79899 Other long term (current) drug therapy: Secondary | ICD-10-CM | POA: Diagnosis not present

## 2018-01-04 DIAGNOSIS — M810 Age-related osteoporosis without current pathological fracture: Secondary | ICD-10-CM | POA: Diagnosis not present

## 2018-01-04 DIAGNOSIS — Z1239 Encounter for other screening for malignant neoplasm of breast: Secondary | ICD-10-CM | POA: Diagnosis not present

## 2018-01-04 DIAGNOSIS — Z0000 Encounter for general adult medical examination without abnormal findings: Secondary | ICD-10-CM | POA: Diagnosis not present

## 2018-01-04 DIAGNOSIS — F3341 Major depressive disorder, recurrent, in partial remission: Secondary | ICD-10-CM | POA: Diagnosis not present

## 2018-01-04 DIAGNOSIS — R911 Solitary pulmonary nodule: Secondary | ICD-10-CM | POA: Diagnosis not present

## 2018-01-04 DIAGNOSIS — J431 Panlobular emphysema: Secondary | ICD-10-CM | POA: Diagnosis not present

## 2018-01-05 ENCOUNTER — Other Ambulatory Visit: Payer: Self-pay | Admitting: Internal Medicine

## 2018-01-05 DIAGNOSIS — R911 Solitary pulmonary nodule: Secondary | ICD-10-CM

## 2018-01-07 DIAGNOSIS — E7800 Pure hypercholesterolemia, unspecified: Secondary | ICD-10-CM | POA: Diagnosis not present

## 2018-01-07 DIAGNOSIS — Z79899 Other long term (current) drug therapy: Secondary | ICD-10-CM | POA: Diagnosis not present

## 2018-01-12 DIAGNOSIS — Z79899 Other long term (current) drug therapy: Secondary | ICD-10-CM | POA: Diagnosis not present

## 2018-01-13 ENCOUNTER — Ambulatory Visit
Admission: RE | Admit: 2018-01-13 | Discharge: 2018-01-13 | Disposition: A | Discharge status: Home / Self Care | Payer: PPO | Source: Ambulatory Visit | Attending: Internal Medicine | Admitting: Internal Medicine

## 2018-01-13 DIAGNOSIS — J439 Emphysema, unspecified: Secondary | ICD-10-CM | POA: Insufficient documentation

## 2018-01-13 DIAGNOSIS — R918 Other nonspecific abnormal finding of lung field: Secondary | ICD-10-CM | POA: Diagnosis not present

## 2018-01-13 DIAGNOSIS — R911 Solitary pulmonary nodule: Secondary | ICD-10-CM

## 2018-01-13 DIAGNOSIS — I700 Atherosclerosis of aorta: Secondary | ICD-10-CM | POA: Diagnosis not present

## 2018-01-15 ENCOUNTER — Other Ambulatory Visit: Payer: Self-pay | Admitting: Internal Medicine

## 2018-01-15 DIAGNOSIS — R918 Other nonspecific abnormal finding of lung field: Secondary | ICD-10-CM

## 2018-02-08 ENCOUNTER — Other Ambulatory Visit: Payer: Self-pay | Admitting: Internal Medicine

## 2018-02-08 DIAGNOSIS — Z1231 Encounter for screening mammogram for malignant neoplasm of breast: Secondary | ICD-10-CM

## 2018-02-10 DIAGNOSIS — M810 Age-related osteoporosis without current pathological fracture: Secondary | ICD-10-CM | POA: Diagnosis not present

## 2018-02-19 DIAGNOSIS — M4696 Unspecified inflammatory spondylopathy, lumbar region: Secondary | ICD-10-CM | POA: Diagnosis not present

## 2018-02-19 DIAGNOSIS — M545 Low back pain: Secondary | ICD-10-CM | POA: Diagnosis not present

## 2018-02-24 ENCOUNTER — Ambulatory Visit
Admission: RE | Admit: 2018-02-24 | Discharge: 2018-02-24 | Disposition: A | Discharge status: Home / Self Care | Payer: PPO | Source: Ambulatory Visit | Attending: Internal Medicine | Admitting: Internal Medicine

## 2018-02-24 DIAGNOSIS — Z1231 Encounter for screening mammogram for malignant neoplasm of breast: Secondary | ICD-10-CM | POA: Insufficient documentation

## 2018-05-03 DIAGNOSIS — M9904 Segmental and somatic dysfunction of sacral region: Secondary | ICD-10-CM | POA: Diagnosis not present

## 2018-05-03 DIAGNOSIS — M461 Sacroiliitis, not elsewhere classified: Secondary | ICD-10-CM | POA: Diagnosis not present

## 2018-05-03 DIAGNOSIS — M9903 Segmental and somatic dysfunction of lumbar region: Secondary | ICD-10-CM | POA: Diagnosis not present

## 2018-05-03 DIAGNOSIS — M5441 Lumbago with sciatica, right side: Secondary | ICD-10-CM | POA: Diagnosis not present

## 2018-05-03 DIAGNOSIS — M545 Low back pain: Secondary | ICD-10-CM | POA: Diagnosis not present

## 2018-05-10 DIAGNOSIS — M545 Low back pain: Secondary | ICD-10-CM | POA: Diagnosis not present

## 2018-05-10 DIAGNOSIS — M9904 Segmental and somatic dysfunction of sacral region: Secondary | ICD-10-CM | POA: Diagnosis not present

## 2018-05-10 DIAGNOSIS — M9903 Segmental and somatic dysfunction of lumbar region: Secondary | ICD-10-CM | POA: Diagnosis not present

## 2018-05-10 DIAGNOSIS — M461 Sacroiliitis, not elsewhere classified: Secondary | ICD-10-CM | POA: Diagnosis not present

## 2018-05-10 DIAGNOSIS — M5441 Lumbago with sciatica, right side: Secondary | ICD-10-CM | POA: Diagnosis not present

## 2018-05-13 DIAGNOSIS — M9903 Segmental and somatic dysfunction of lumbar region: Secondary | ICD-10-CM | POA: Diagnosis not present

## 2018-05-13 DIAGNOSIS — M461 Sacroiliitis, not elsewhere classified: Secondary | ICD-10-CM | POA: Diagnosis not present

## 2018-05-13 DIAGNOSIS — M5441 Lumbago with sciatica, right side: Secondary | ICD-10-CM | POA: Diagnosis not present

## 2018-05-13 DIAGNOSIS — M545 Low back pain: Secondary | ICD-10-CM | POA: Diagnosis not present

## 2018-05-13 DIAGNOSIS — M9904 Segmental and somatic dysfunction of sacral region: Secondary | ICD-10-CM | POA: Diagnosis not present

## 2018-05-17 DIAGNOSIS — M461 Sacroiliitis, not elsewhere classified: Secondary | ICD-10-CM | POA: Diagnosis not present

## 2018-05-17 DIAGNOSIS — M9903 Segmental and somatic dysfunction of lumbar region: Secondary | ICD-10-CM | POA: Diagnosis not present

## 2018-05-17 DIAGNOSIS — M545 Low back pain: Secondary | ICD-10-CM | POA: Diagnosis not present

## 2018-05-17 DIAGNOSIS — M9904 Segmental and somatic dysfunction of sacral region: Secondary | ICD-10-CM | POA: Diagnosis not present

## 2018-05-17 DIAGNOSIS — M5441 Lumbago with sciatica, right side: Secondary | ICD-10-CM | POA: Diagnosis not present

## 2018-05-20 DIAGNOSIS — M9903 Segmental and somatic dysfunction of lumbar region: Secondary | ICD-10-CM | POA: Diagnosis not present

## 2018-05-20 DIAGNOSIS — M5441 Lumbago with sciatica, right side: Secondary | ICD-10-CM | POA: Diagnosis not present

## 2018-05-20 DIAGNOSIS — M545 Low back pain: Secondary | ICD-10-CM | POA: Diagnosis not present

## 2018-05-20 DIAGNOSIS — M9904 Segmental and somatic dysfunction of sacral region: Secondary | ICD-10-CM | POA: Diagnosis not present

## 2018-05-20 DIAGNOSIS — M461 Sacroiliitis, not elsewhere classified: Secondary | ICD-10-CM | POA: Diagnosis not present

## 2018-05-24 DIAGNOSIS — M5441 Lumbago with sciatica, right side: Secondary | ICD-10-CM | POA: Diagnosis not present

## 2018-05-24 DIAGNOSIS — M9903 Segmental and somatic dysfunction of lumbar region: Secondary | ICD-10-CM | POA: Diagnosis not present

## 2018-05-24 DIAGNOSIS — M545 Low back pain: Secondary | ICD-10-CM | POA: Diagnosis not present

## 2018-05-24 DIAGNOSIS — M9904 Segmental and somatic dysfunction of sacral region: Secondary | ICD-10-CM | POA: Diagnosis not present

## 2018-05-24 DIAGNOSIS — M461 Sacroiliitis, not elsewhere classified: Secondary | ICD-10-CM | POA: Diagnosis not present

## 2018-05-27 DIAGNOSIS — M9903 Segmental and somatic dysfunction of lumbar region: Secondary | ICD-10-CM | POA: Diagnosis not present

## 2018-05-27 DIAGNOSIS — M545 Low back pain: Secondary | ICD-10-CM | POA: Diagnosis not present

## 2018-05-27 DIAGNOSIS — M9904 Segmental and somatic dysfunction of sacral region: Secondary | ICD-10-CM | POA: Diagnosis not present

## 2018-05-27 DIAGNOSIS — M5441 Lumbago with sciatica, right side: Secondary | ICD-10-CM | POA: Diagnosis not present

## 2018-05-27 DIAGNOSIS — M461 Sacroiliitis, not elsewhere classified: Secondary | ICD-10-CM | POA: Diagnosis not present

## 2018-06-03 DIAGNOSIS — M9903 Segmental and somatic dysfunction of lumbar region: Secondary | ICD-10-CM | POA: Diagnosis not present

## 2018-06-03 DIAGNOSIS — M9904 Segmental and somatic dysfunction of sacral region: Secondary | ICD-10-CM | POA: Diagnosis not present

## 2018-06-03 DIAGNOSIS — M461 Sacroiliitis, not elsewhere classified: Secondary | ICD-10-CM | POA: Diagnosis not present

## 2018-06-03 DIAGNOSIS — M545 Low back pain: Secondary | ICD-10-CM | POA: Diagnosis not present

## 2018-06-03 DIAGNOSIS — M5441 Lumbago with sciatica, right side: Secondary | ICD-10-CM | POA: Diagnosis not present

## 2018-07-28 DIAGNOSIS — F3341 Major depressive disorder, recurrent, in partial remission: Secondary | ICD-10-CM | POA: Diagnosis not present

## 2018-07-28 DIAGNOSIS — J312 Chronic pharyngitis: Secondary | ICD-10-CM | POA: Diagnosis not present

## 2018-07-28 DIAGNOSIS — R918 Other nonspecific abnormal finding of lung field: Secondary | ICD-10-CM | POA: Diagnosis not present

## 2018-11-03 DIAGNOSIS — R070 Pain in throat: Secondary | ICD-10-CM | POA: Diagnosis not present

## 2019-01-06 ENCOUNTER — Other Ambulatory Visit: Payer: Self-pay | Admitting: Internal Medicine

## 2019-01-06 DIAGNOSIS — Z79899 Other long term (current) drug therapy: Secondary | ICD-10-CM | POA: Diagnosis not present

## 2019-01-06 DIAGNOSIS — G4733 Obstructive sleep apnea (adult) (pediatric): Secondary | ICD-10-CM | POA: Insufficient documentation

## 2019-01-06 DIAGNOSIS — F3341 Major depressive disorder, recurrent, in partial remission: Secondary | ICD-10-CM | POA: Diagnosis not present

## 2019-01-06 DIAGNOSIS — M810 Age-related osteoporosis without current pathological fracture: Secondary | ICD-10-CM | POA: Diagnosis not present

## 2019-01-06 DIAGNOSIS — R918 Other nonspecific abnormal finding of lung field: Secondary | ICD-10-CM

## 2019-01-06 DIAGNOSIS — Z0000 Encounter for general adult medical examination without abnormal findings: Secondary | ICD-10-CM | POA: Diagnosis not present

## 2019-01-06 DIAGNOSIS — J431 Panlobular emphysema: Secondary | ICD-10-CM | POA: Diagnosis not present

## 2019-01-06 DIAGNOSIS — Z1239 Encounter for other screening for malignant neoplasm of breast: Secondary | ICD-10-CM | POA: Diagnosis not present

## 2019-01-06 DIAGNOSIS — Z720 Tobacco use: Secondary | ICD-10-CM | POA: Diagnosis not present

## 2019-01-06 DIAGNOSIS — E7800 Pure hypercholesterolemia, unspecified: Secondary | ICD-10-CM | POA: Diagnosis not present

## 2019-01-20 ENCOUNTER — Other Ambulatory Visit: Payer: Self-pay

## 2019-01-20 ENCOUNTER — Ambulatory Visit
Admission: RE | Admit: 2019-01-20 | Discharge: 2019-01-20 | Disposition: A | Discharge status: Home / Self Care | Payer: PPO | Source: Ambulatory Visit | Attending: Internal Medicine | Admitting: Internal Medicine

## 2019-01-20 DIAGNOSIS — R918 Other nonspecific abnormal finding of lung field: Secondary | ICD-10-CM | POA: Diagnosis not present

## 2019-01-20 DIAGNOSIS — J439 Emphysema, unspecified: Secondary | ICD-10-CM | POA: Diagnosis not present

## 2019-03-29 DIAGNOSIS — H2513 Age-related nuclear cataract, bilateral: Secondary | ICD-10-CM | POA: Diagnosis not present

## 2019-09-27 ENCOUNTER — Ambulatory Visit: Payer: Medicare HMO | Admitting: Podiatry

## 2019-09-27 ENCOUNTER — Encounter: Payer: Self-pay | Admitting: Podiatry

## 2019-09-27 ENCOUNTER — Other Ambulatory Visit: Payer: Self-pay

## 2019-09-27 ENCOUNTER — Ambulatory Visit (INDEPENDENT_AMBULATORY_CARE_PROVIDER_SITE_OTHER): Payer: Medicare HMO

## 2019-09-27 ENCOUNTER — Other Ambulatory Visit: Payer: Self-pay | Admitting: Podiatry

## 2019-09-27 DIAGNOSIS — K5790 Diverticulosis of intestine, part unspecified, without perforation or abscess without bleeding: Secondary | ICD-10-CM | POA: Insufficient documentation

## 2019-09-27 DIAGNOSIS — M7752 Other enthesopathy of left foot: Secondary | ICD-10-CM | POA: Diagnosis not present

## 2019-09-27 DIAGNOSIS — S99922A Unspecified injury of left foot, initial encounter: Secondary | ICD-10-CM | POA: Diagnosis not present

## 2019-09-27 DIAGNOSIS — R0789 Other chest pain: Secondary | ICD-10-CM | POA: Insufficient documentation

## 2019-09-27 DIAGNOSIS — M5030 Other cervical disc degeneration, unspecified cervical region: Secondary | ICD-10-CM | POA: Insufficient documentation

## 2019-09-27 DIAGNOSIS — Z720 Tobacco use: Secondary | ICD-10-CM | POA: Insufficient documentation

## 2019-09-27 DIAGNOSIS — J449 Chronic obstructive pulmonary disease, unspecified: Secondary | ICD-10-CM | POA: Insufficient documentation

## 2019-09-27 DIAGNOSIS — R091 Pleurisy: Secondary | ICD-10-CM | POA: Insufficient documentation

## 2019-09-27 DIAGNOSIS — M810 Age-related osteoporosis without current pathological fracture: Secondary | ICD-10-CM | POA: Insufficient documentation

## 2019-09-27 DIAGNOSIS — F32A Depression, unspecified: Secondary | ICD-10-CM | POA: Insufficient documentation

## 2019-09-27 DIAGNOSIS — N6019 Diffuse cystic mastopathy of unspecified breast: Secondary | ICD-10-CM | POA: Insufficient documentation

## 2019-09-30 NOTE — Progress Notes (Signed)
   HPI: 69 y.o. female presenting today with a chief complaint of left foot pain that began 2 months ago. She states she dropped a piece of slate on the foot and is still experiencing redness and pain of the foot. Wearing certain shoes increases the pain. She has taken Ibuprofen when the injury occurred and has been icing the foot. Patient is here for further evaluation and treatment.   Past Medical History:  Diagnosis Date  . Anxiety   . Arthritis   . Diverticulosis, gastric   . Psoriasis   . Sleep apnea    cpap     Physical Exam: General: The patient is alert and oriented x3 in no acute distress.  Dermatology: Skin is warm, dry and supple bilateral lower extremities. Negative for open lesions or macerations.  Vascular: Palpable pedal pulses bilaterally. No edema or erythema noted. Capillary refill within normal limits.  Neurological: Epicritic and protective threshold grossly intact bilaterally.   Musculoskeletal Exam: Pain with palpation noted to the 5th MPJ of the left foot. Range of motion within normal limits to all pedal and ankle joints bilateral. Muscle strength 5/5 in all groups bilateral.   Radiographic Exam:  Normal osseous mineralization. Joint spaces preserved. No fracture/dislocation/boney destruction.    Assessment: 1. 5th MPJ capsulitis left 2. H/o left 5th metatarsal head fracture - healed    Plan of Care:  1. Patient evaluated. X-Rays reviewed.  2. Declined post op shoe.  3. Declined oral NSAIDs.  4. Recommended good shoe gear.  5. Return to clinic as needed.      Edrick Kins, DPM [facility]  Dr. Edrick Kins, DPM    [address], [place] [address]                Office [phone]  Fax [phone]

## 2020-01-10 ENCOUNTER — Other Ambulatory Visit: Payer: Self-pay | Admitting: Internal Medicine

## 2020-01-10 DIAGNOSIS — Z1231 Encounter for screening mammogram for malignant neoplasm of breast: Secondary | ICD-10-CM

## 2020-01-12 ENCOUNTER — Other Ambulatory Visit: Payer: Self-pay | Admitting: Physical Medicine and Rehabilitation

## 2020-01-12 DIAGNOSIS — M5416 Radiculopathy, lumbar region: Secondary | ICD-10-CM

## 2020-01-24 LAB — COLOGUARD: COLOGUARD: NEGATIVE

## 2020-01-30 ENCOUNTER — Other Ambulatory Visit: Payer: Self-pay

## 2020-01-30 ENCOUNTER — Ambulatory Visit
Admission: RE | Admit: 2020-01-30 | Discharge: 2020-01-30 | Disposition: A | Discharge status: Home / Self Care | Payer: Medicare HMO | Source: Ambulatory Visit | Attending: Physical Medicine and Rehabilitation | Admitting: Physical Medicine and Rehabilitation

## 2020-01-30 DIAGNOSIS — M5416 Radiculopathy, lumbar region: Secondary | ICD-10-CM | POA: Insufficient documentation

## 2020-03-02 ENCOUNTER — Other Ambulatory Visit: Payer: Self-pay

## 2020-03-02 ENCOUNTER — Ambulatory Visit
Admission: RE | Admit: 2020-03-02 | Discharge: 2020-03-02 | Disposition: A | Discharge status: Home / Self Care | Payer: Medicare HMO | Source: Ambulatory Visit | Attending: Internal Medicine | Admitting: Internal Medicine

## 2020-03-02 DIAGNOSIS — Z1231 Encounter for screening mammogram for malignant neoplasm of breast: Secondary | ICD-10-CM

## 2020-05-28 ENCOUNTER — Other Ambulatory Visit: Payer: Self-pay

## 2020-05-28 ENCOUNTER — Encounter: Payer: Self-pay | Admitting: Ophthalmology

## 2020-05-31 ENCOUNTER — Other Ambulatory Visit
Admission: RE | Admit: 2020-05-31 | Discharge: 2020-05-31 | Disposition: A | Discharge status: Home / Self Care | Payer: Medicare HMO | Source: Ambulatory Visit | Attending: Ophthalmology | Admitting: Ophthalmology

## 2020-05-31 ENCOUNTER — Other Ambulatory Visit: Payer: Self-pay

## 2020-05-31 DIAGNOSIS — Z01812 Encounter for preprocedural laboratory examination: Secondary | ICD-10-CM | POA: Insufficient documentation

## 2020-05-31 DIAGNOSIS — Z20822 Contact with and (suspected) exposure to covid-19: Secondary | ICD-10-CM | POA: Insufficient documentation

## 2020-05-31 LAB — SARS CORONAVIRUS 2 (TAT 6-24 HRS): SARS Coronavirus 2: NEGATIVE

## 2020-05-31 NOTE — Discharge Instructions (Signed)

## 2020-06-04 ENCOUNTER — Ambulatory Visit: Payer: Medicare HMO | Admitting: Anesthesiology

## 2020-06-04 ENCOUNTER — Encounter: Payer: Self-pay | Admitting: Ophthalmology

## 2020-06-04 ENCOUNTER — Ambulatory Visit
Admission: RE | Admit: 2020-06-04 | Discharge: 2020-06-04 | Disposition: A | Discharge status: Home / Self Care | Payer: Medicare HMO | Attending: Ophthalmology | Admitting: Ophthalmology

## 2020-06-04 ENCOUNTER — Encounter
Admission: RE | Disposition: A | Discharge status: Home / Self Care | Payer: Self-pay | Source: Home / Self Care | Attending: Ophthalmology

## 2020-06-04 ENCOUNTER — Other Ambulatory Visit: Payer: Self-pay

## 2020-06-04 DIAGNOSIS — Z9049 Acquired absence of other specified parts of digestive tract: Secondary | ICD-10-CM | POA: Diagnosis not present

## 2020-06-04 DIAGNOSIS — Z90710 Acquired absence of both cervix and uterus: Secondary | ICD-10-CM | POA: Insufficient documentation

## 2020-06-04 DIAGNOSIS — H2512 Age-related nuclear cataract, left eye: Secondary | ICD-10-CM | POA: Insufficient documentation

## 2020-06-04 DIAGNOSIS — Z7983 Long term (current) use of bisphosphonates: Secondary | ICD-10-CM | POA: Insufficient documentation

## 2020-06-04 DIAGNOSIS — F17210 Nicotine dependence, cigarettes, uncomplicated: Secondary | ICD-10-CM | POA: Diagnosis not present

## 2020-06-04 DIAGNOSIS — Z79899 Other long term (current) drug therapy: Secondary | ICD-10-CM | POA: Insufficient documentation

## 2020-06-04 HISTORY — PX: CATARACT EXTRACTION W/PHACO: SHX586

## 2020-06-04 SURGERY — PHACOEMULSIFICATION, CATARACT, WITH IOL INSERTION
Anesthesia: Monitor Anesthesia Care | Site: Eye | Laterality: Left

## 2020-06-04 MED ORDER — MIDAZOLAM HCL 2 MG/2ML IJ SOLN
INTRAMUSCULAR | Status: DC | PRN
  Administered 2020-06-04: 1 mg via INTRAVENOUS

## 2020-06-04 MED ORDER — ACETAMINOPHEN 160 MG/5ML PO SOLN: 325.0000 mg | Freq: Once | ORAL | Status: DC

## 2020-06-04 MED ORDER — ARMC OPHTHALMIC DILATING DROPS
1.0000 "application " | OPHTHALMIC | Status: DC | PRN
  Administered 2020-06-04 (×3): 1 via OPHTHALMIC

## 2020-06-04 MED ORDER — EPINEPHRINE PF 1 MG/ML IJ SOLN: INTRAOCULAR | Status: DC | PRN

## 2020-06-04 MED ORDER — LIDOCAINE HCL (PF) 2 % IJ SOLN
INTRAOCULAR | Status: DC | PRN
  Administered 2020-06-04: 1 mL via INTRAOCULAR

## 2020-06-04 MED ORDER — SODIUM HYALURONATE 10 MG/ML IO SOLN
INTRAOCULAR | Status: DC | PRN
  Administered 2020-06-04: 0.55 mL via INTRAOCULAR

## 2020-06-04 MED ORDER — SODIUM HYALURONATE 23 MG/ML IO SOLN
INTRAOCULAR | Status: DC | PRN
  Administered 2020-06-04: 0.6 mL via INTRAOCULAR

## 2020-06-04 MED ORDER — ACETAMINOPHEN 325 MG PO TABS: 325.0000 mg | ORAL_TABLET | Freq: Once | ORAL | Status: DC

## 2020-06-04 MED ORDER — FENTANYL CITRATE (PF) 100 MCG/2ML IJ SOLN
INTRAMUSCULAR | Status: DC | PRN
  Administered 2020-06-04: 50 ug via INTRAVENOUS

## 2020-06-04 MED ORDER — MOXIFLOXACIN HCL 0.5 % OP SOLN
OPHTHALMIC | Status: DC | PRN
  Administered 2020-06-04: 0.2 mL via OPHTHALMIC

## 2020-06-04 MED ORDER — TETRACAINE HCL 0.5 % OP SOLN
1.0000 [drp] | OPHTHALMIC | Status: DC | PRN
  Administered 2020-06-04 (×3): 1 [drp] via OPHTHALMIC

## 2020-06-04 MED ORDER — LACTATED RINGERS IV SOLN: INTRAVENOUS | Status: DC

## 2020-06-04 SURGICAL SUPPLY — 19 items
CANNULA ANT/CHMB 27GA (MISCELLANEOUS) ×4 IMPLANT
DISSECTOR HYDRO NUCLEUS 50X22 (MISCELLANEOUS) ×2 IMPLANT
GLOVE SURG LX 7.5 STRW (GLOVE) ×2
GLOVE SURG LX STRL 7.5 STRW (GLOVE) ×2 IMPLANT
GLOVE SURG SYN 8.5  E (GLOVE) ×1
GLOVE SURG SYN 8.5 E (GLOVE) ×1 IMPLANT
GOWN STRL REUS W/ TWL LRG LVL3 (GOWN DISPOSABLE) ×2 IMPLANT
GOWN STRL REUS W/TWL LRG LVL3 (GOWN DISPOSABLE) ×4
LENS ACRYSOF TORIC TRIFOC 17.5 ×2 IMPLANT
LENS IOL 0 D +17.5 DIOP +1.5 ×1 IMPLANT
LENS IOL IQ PAN TRC 30 17.5 ×1 IMPLANT
MARKER SKIN DUAL TIP RULER LAB (MISCELLANEOUS) ×2 IMPLANT
PACK DR. KING ARMS (PACKS) ×2 IMPLANT
PACK EYE AFTER SURG (MISCELLANEOUS) ×2 IMPLANT
PACK OPTHALMIC (MISCELLANEOUS) ×2 IMPLANT
SYR 3ML LL SCALE MARK (SYRINGE) ×2 IMPLANT
SYR TB 1ML LUER SLIP (SYRINGE) ×2 IMPLANT
WATER STERILE IRR 250ML POUR (IV SOLUTION) ×2 IMPLANT
WIPE NON LINTING 3.25X3.25 (MISCELLANEOUS) ×2 IMPLANT

## 2020-06-04 NOTE — Anesthesia Preprocedure Evaluation (Signed)
Anesthesia Evaluation  Patient identified by MRN, date of birth, ID band Patient awake    Reviewed: Allergy & Precautions, H&P , NPO status , Patient's Chart, lab work & pertinent test results  Airway Mallampati: II  TM Distance: >3 FB Neck ROM: full    Dental no notable dental hx.    Pulmonary COPD, Current Smoker and Patient abstained from smoking.,    Pulmonary exam normal breath sounds clear to auscultation       Cardiovascular Normal cardiovascular exam Rhythm:regular Rate:Normal     Neuro/Psych    GI/Hepatic   Endo/Other    Renal/GU      Musculoskeletal   Abdominal   Peds  Hematology   Anesthesia Other Findings   Reproductive/Obstetrics                             Anesthesia Physical Anesthesia Plan  ASA: II  Anesthesia Plan: MAC   Post-op Pain Management:    Induction:   PONV Risk Score and Plan: 2 and Treatment may vary due to age or medical condition, Midazolam and TIVA  Airway Management Planned:   Additional Equipment:   Intra-op Plan:   Post-operative Plan:   Informed Consent: I have reviewed the patients History and Physical, chart, labs and discussed the procedure including the risks, benefits and alternatives for the proposed anesthesia with the patient or authorized representative who has indicated his/her understanding and acceptance.     Dental Advisory Given  Plan Discussed with: CRNA  Anesthesia Plan Comments:         Anesthesia Quick Evaluation

## 2020-06-04 NOTE — Anesthesia Postprocedure Evaluation (Signed)
Anesthesia Post Note  Patient: [name]  Procedure(s) Performed: CATARACT EXTRACTION PHACO AND INTRAOCULAR LENS PLACEMENT (IOC) LEFT PANOPTIX TORIC LENS (Left Eye)     Patient location during evaluation: PACU Anesthesia Type: MAC Level of consciousness: awake and alert and oriented Pain management: satisfactory to patient Vital Signs Assessment: post-procedure vital signs reviewed and stable Respiratory status: spontaneous breathing, nonlabored ventilation and respiratory function stable Cardiovascular status: blood pressure returned to baseline and stable Postop Assessment: Adequate PO intake and No signs of nausea or vomiting Anesthetic complications: no   No complications documented.  Raliegh Ip

## 2020-06-04 NOTE — Transfer of Care (Signed)
Immediate Anesthesia Transfer of Care Note  Patient: [name]  Procedure(s) Performed: CATARACT EXTRACTION PHACO AND INTRAOCULAR LENS PLACEMENT (IOC) LEFT PANOPTIX TORIC LENS (Left Eye)  Patient Location: PACU  Anesthesia Type: MAC  Level of Consciousness: awake, alert  and patient cooperative  Airway and Oxygen Therapy: Patient Spontanous Breathing and Patient connected to supplemental oxygen  Post-op Assessment: Post-op Vital signs reviewed, Patient's Cardiovascular Status Stable, Respiratory Function Stable, Patent Airway and No signs of Nausea or vomiting  Post-op Vital Signs: Reviewed and stable  Complications: No complications documented.

## 2020-06-04 NOTE — Op Note (Signed)
OPERATIVE NOTE  ODETH BRY [id number] 06/04/2020   PREOPERATIVE DIAGNOSIS:  Nuclear sclerotic cataract left eye.  H25.12   POSTOPERATIVE DIAGNOSIS:    Nuclear sclerotic cataract left eye.     PROCEDURE:  Phacoemusification with posterior chamber intraocular lens placement of the left eye   LENS:   Implant Name Type Inv. Item Serial No. Manufacturer Lot No. LRB No. Used Action  ACRYSOF TORIC TRIFOCAL 17.5 - [id number]  ACRYSOF TORIC TRIFOCAL 17.5 [id number] ALCON  Left 1 Implanted      Procedure(s) with comments: CATARACT EXTRACTION PHACO AND INTRAOCULAR LENS PLACEMENT (IOC) LEFT PANOPTIX TORIC LENS (Left) - 4.49 0:37.2  TFNT30 +17.5   ULTRASOUND TIME: 0 minutes 37 seconds.  CDE 4.49   SURGEON:  Benay Pillow, MD, MPH   ANESTHESIA:  Topical with tetracaine drops augmented with 1% preservative-free intracameral lidocaine.  ESTIMATED BLOOD LOSS: <1 mL   COMPLICATIONS:  None.   DESCRIPTION OF PROCEDURE:  The patient was identified in the holding room and transported to the operating room and placed in the supine position under the operating microscope.  The left eye was identified as the operative eye and it was prepped and draped in the usual sterile ophthalmic fashion.  The verion system was registered without difficulty.   A 1.0 millimeter clear-corneal paracentesis was made at the 5:00 position. 0.5 ml of preservative-free 1% lidocaine with epinephrine was injected into the anterior chamber.  The anterior chamber was filled with Healon 5 viscoelastic.  A 2.4 millimeter keratome was used to make a near-clear corneal incision at the 2:00 position.  A curvilinear capsulorrhexis was made with a cystotome and capsulorrhexis forceps.  Balanced salt solution was used to hydrodissect and hydrodelineate the nucleus.   Phacoemulsification was then used in stop and chop fashion to remove the lens nucleus and epinucleus.  The remaining cortex was then removed using the irrigation and  aspiration handpiece. Healon was then placed into the capsular bag to distend it for lens placement.  A lens was then injected into the capsular bag.  The remaining viscoelastic was aspirated.  The lens was rotated with guidance from the [place] system. 162 degrees.   Wounds were hydrated with balanced salt solution.  The anterior chamber was inflated to a physiologic pressure with balanced salt solution.  Intracameral vigamox 0.1 mL undiltued was injected into the eye and a drop placed onto the ocular surface.  No wound leaks were noted.  The patient was taken to the recovery room in stable condition without complications of anesthesia or surgery  Benay Pillow 06/04/2020, 12:30 PM

## 2020-06-04 NOTE — H&P (Signed)
[facility]   Primary Care Physician:  Idelle Crouch, MD Ophthalmologist: Dr. Benay Pillow  Pre-Procedure History & Physical: HPI:  [name] is a 70 y.o. female here for cataract surgery.   Past Medical History:  Diagnosis Date  . Anxiety   . Arthritis   . Diverticulosis, gastric   . Psoriasis   . Sleep apnea    cpap (05/28/20 - no longer uses)    Past Surgical History:  Procedure Laterality Date  . ABDOMINAL HYSTERECTOMY    . BACK SURGERY     plate at c4  . BREAST BIOPSY Right 1990's   benign  . CESAREAN SECTION     x2  . CHOLECYSTECTOMY    . MECKEL DIVERTICULUM EXCISION    . THUMB ARTHROSCOPY    . TRIGGER FINGER RELEASE Left 11/17/2016   Procedure: LEFT TRIGGER THUMB RELEASE;  Surgeon: Milly Jakob, MD;  Location: [facility];  Service: Orthopedics;  Laterality: Left;    Prior to Admission medications   Medication Sig Start Date End Date Taking? Authorizing Provider  acetaminophen (TYLENOL) 325 MG tablet Take 2 tablets (650 mg total) by mouth every 6 (six) hours as needed for mild pain or moderate pain. 11/17/16  Yes Milly Jakob, MD  alendronate (FOSAMAX) 70 MG tablet Take by mouth. 12/25/15  Yes [provider]  amitriptyline (ELAVIL) 25 MG tablet Take by mouth. 11/26/15 05/28/20 Yes [provider]  Calcium Carbonate (CALCIUM 500 PO) Take by mouth daily.   Yes [provider]  Cyanocobalamin (VITAMIN B-12 PO) Take 1,000 mcg by mouth.   Yes [provider]  FLUoxetine (PROZAC) 40 MG capsule Take by mouth. 11/26/15  Yes [provider]  ibuprofen (ADVIL) 200 MG tablet Take 3 tablets (600 mg total) by mouth every 6 (six) hours as needed for mild pain or moderate pain. 11/17/16  Yes Milly Jakob, MD  Misc Natural Products (OSTEO BI-FLEX/5-LOXIN ADVANCED PO) Take 2 tablets by mouth daily.    Yes [provider]  Multiple Vitamin (MULTI-VITAMIN) tablet Take by mouth.   Yes [provider]    Allergies as of 03/30/2020  . (No Known Allergies)    Family History  Problem Relation Age of Onset  . Breast cancer Mother 7    Social History   Socioeconomic History  . Marital status: Single    Spouse name: Not on file  . Number of children: Not on file  . Years of education: Not on file  . Highest education level: Not on file  Occupational History  . Not on file  Tobacco Use  . Smoking status: Current Every Day Smoker    Packs/day: 0.50    Years: 50.00    Pack years: 25.00    Types: Cigarettes  . Smokeless tobacco: Never Used  . Tobacco comment: started age 16  Vaping Use  . Vaping Use: Every day  . Substances: Nicotine  . Devices: Evod  Substance and Sexual Activity  . Alcohol use: No    Comment: Rare  . Drug use: No  . Sexual activity: Not on file  Other Topics Concern  . Not on file  Social History Narrative  . Not on file   Social Determinants of Health   Financial Resource Strain: Not on file  Food Insecurity: Not on file  Transportation Needs: Not on file  Physical Activity: Not on file  Stress: Not on file  Social Connections: Not on file  Intimate Partner Violence:  Not on file    Review of Systems: See HPI, otherwise negative ROS  Physical Exam: BP 138/74   Pulse 71   Temp (!) 97 F (36.1 C) (Temporal)   Resp 16   Ht 5\' 5"  (1.651 m)   Wt 61.7 kg   SpO2 100%   BMI 22.63 kg/m  General:   Alert,  pleasant and cooperative in NAD Head:  Normocephalic and atraumatic. Respiratory:  Normal work of breathing.  Impression/Plan: [name] is here for cataract surgery.  Risks, benefits, limitations, and alternatives regarding cataract surgery have been reviewed with the patient.  Questions have been answered.  All parties agreeable.   Benay Pillow, MD  06/04/2020, 11:56 AM

## 2020-06-04 NOTE — Anesthesia Procedure Notes (Signed)
Procedure Name: MAC Date/Time: 06/04/2020 12:06 PM Performed by: Cameron Ali, CRNA Pre-anesthesia Checklist: Patient identified, Emergency Drugs available, Suction available, Timeout performed and Patient being monitored Patient Re-evaluated:Patient Re-evaluated prior to induction Oxygen Delivery Method: Nasal cannula Placement Confirmation: positive ETCO2

## 2020-06-05 ENCOUNTER — Encounter: Payer: Self-pay | Admitting: Ophthalmology

## 2020-06-06 ENCOUNTER — Other Ambulatory Visit: Payer: Self-pay

## 2020-06-06 ENCOUNTER — Encounter: Payer: Self-pay | Admitting: Ophthalmology

## 2020-06-14 ENCOUNTER — Other Ambulatory Visit: Payer: Self-pay

## 2020-06-14 ENCOUNTER — Other Ambulatory Visit
Admission: RE | Admit: 2020-06-14 | Discharge: 2020-06-14 | Disposition: A | Discharge status: Home / Self Care | Payer: Medicare HMO | Source: Ambulatory Visit | Attending: Ophthalmology | Admitting: Ophthalmology

## 2020-06-14 DIAGNOSIS — Z01812 Encounter for preprocedural laboratory examination: Secondary | ICD-10-CM | POA: Diagnosis present

## 2020-06-14 DIAGNOSIS — Z20822 Contact with and (suspected) exposure to covid-19: Secondary | ICD-10-CM | POA: Diagnosis not present

## 2020-06-14 LAB — SARS CORONAVIRUS 2 (TAT 6-24 HRS): SARS Coronavirus 2: NEGATIVE

## 2020-06-14 NOTE — Discharge Instructions (Signed)

## 2020-06-18 ENCOUNTER — Ambulatory Visit
Admission: RE | Admit: 2020-06-18 | Discharge: 2020-06-18 | Disposition: A | Discharge status: Home / Self Care | Payer: Medicare HMO | Attending: Ophthalmology | Admitting: Ophthalmology

## 2020-06-18 ENCOUNTER — Encounter
Admission: RE | Disposition: A | Discharge status: Home / Self Care | Payer: Self-pay | Source: Home / Self Care | Attending: Ophthalmology

## 2020-06-18 ENCOUNTER — Ambulatory Visit: Payer: Medicare HMO | Admitting: Anesthesiology

## 2020-06-18 ENCOUNTER — Other Ambulatory Visit: Payer: Self-pay

## 2020-06-18 ENCOUNTER — Encounter: Payer: Self-pay | Admitting: Ophthalmology

## 2020-06-18 DIAGNOSIS — Z7983 Long term (current) use of bisphosphonates: Secondary | ICD-10-CM | POA: Diagnosis not present

## 2020-06-18 DIAGNOSIS — H2511 Age-related nuclear cataract, right eye: Secondary | ICD-10-CM | POA: Insufficient documentation

## 2020-06-18 DIAGNOSIS — F17210 Nicotine dependence, cigarettes, uncomplicated: Secondary | ICD-10-CM | POA: Insufficient documentation

## 2020-06-18 DIAGNOSIS — Z79899 Other long term (current) drug therapy: Secondary | ICD-10-CM | POA: Diagnosis not present

## 2020-06-18 HISTORY — PX: CATARACT EXTRACTION W/PHACO: SHX586

## 2020-06-18 SURGERY — PHACOEMULSIFICATION, CATARACT, WITH IOL INSERTION
Anesthesia: Monitor Anesthesia Care | Site: Eye | Laterality: Right

## 2020-06-18 MED ORDER — EPINEPHRINE PF 1 MG/ML IJ SOLN
INTRAOCULAR | Status: DC | PRN
  Administered 2020-06-18: 71 mL via OPHTHALMIC

## 2020-06-18 MED ORDER — FENTANYL CITRATE (PF) 100 MCG/2ML IJ SOLN
INTRAMUSCULAR | Status: DC | PRN
  Administered 2020-06-18: 50 ug via INTRAVENOUS

## 2020-06-18 MED ORDER — ARMC OPHTHALMIC DILATING DROPS
1.0000 "application " | OPHTHALMIC | Status: DC | PRN
  Administered 2020-06-18 (×3): 1 via OPHTHALMIC

## 2020-06-18 MED ORDER — MIDAZOLAM HCL 2 MG/2ML IJ SOLN
INTRAMUSCULAR | Status: DC | PRN
  Administered 2020-06-18: 2 mg via INTRAVENOUS

## 2020-06-18 MED ORDER — LACTATED RINGERS IV SOLN: INTRAVENOUS | Status: DC

## 2020-06-18 MED ORDER — LIDOCAINE HCL (PF) 2 % IJ SOLN
INTRAOCULAR | Status: DC | PRN
  Administered 2020-06-18: 1 mL via INTRAOCULAR

## 2020-06-18 MED ORDER — SODIUM HYALURONATE 23 MG/ML IO SOLN
INTRAOCULAR | Status: DC | PRN
  Administered 2020-06-18: 0.6 mL via INTRAOCULAR

## 2020-06-18 MED ORDER — SODIUM HYALURONATE 10 MG/ML IO SOLN
INTRAOCULAR | Status: DC | PRN
  Administered 2020-06-18: 0.55 mL via INTRAOCULAR

## 2020-06-18 MED ORDER — ACETAMINOPHEN 325 MG PO TABS: 325.0000 mg | ORAL_TABLET | Freq: Once | ORAL | Status: DC

## 2020-06-18 MED ORDER — TETRACAINE HCL 0.5 % OP SOLN
1.0000 [drp] | OPHTHALMIC | Status: DC | PRN
  Administered 2020-06-18 (×3): 1 [drp] via OPHTHALMIC

## 2020-06-18 MED ORDER — ACETAMINOPHEN 160 MG/5ML PO SOLN: 325.0000 mg | Freq: Once | ORAL | Status: DC

## 2020-06-18 MED ORDER — MOXIFLOXACIN HCL 0.5 % OP SOLN
OPHTHALMIC | Status: DC | PRN
  Administered 2020-06-18: 0.2 mL via OPHTHALMIC

## 2020-06-18 SURGICAL SUPPLY — 20 items
CANNULA ANT/CHMB 27G (MISCELLANEOUS) ×2 IMPLANT
CANNULA ANT/CHMB 27GA (MISCELLANEOUS) ×4 IMPLANT
DISSECTOR HYDRO NUCLEUS 50X22 (MISCELLANEOUS) ×2 IMPLANT
GLOVE SURG LX 7.5 STRW (GLOVE) ×1
GLOVE SURG LX STRL 7.5 STRW (GLOVE) ×1 IMPLANT
GLOVE SURG SYN 8.5  E (GLOVE) ×2
GLOVE SURG SYN 8.5 E (GLOVE) ×1 IMPLANT
GLOVE SURG SYN 8.5 PF PI (GLOVE) ×1 IMPLANT
GOWN STRL REUS W/ TWL LRG LVL3 (GOWN DISPOSABLE) ×2 IMPLANT
GOWN STRL REUS W/TWL LRG LVL3 (GOWN DISPOSABLE) ×4
LENS IOL ACRSF IQ PAN 20.0 IMPLANT
LENS IOL IQ PANOPTIX 20.0 ×2 IMPLANT
MARKER SKIN DUAL TIP RULER LAB (MISCELLANEOUS) ×2 IMPLANT
PACK DR. KING ARMS (PACKS) ×2 IMPLANT
PACK EYE AFTER SURG (MISCELLANEOUS) ×2 IMPLANT
PACK OPTHALMIC (MISCELLANEOUS) ×2 IMPLANT
SYR 3ML LL SCALE MARK (SYRINGE) ×2 IMPLANT
SYR TB 1ML LUER SLIP (SYRINGE) ×2 IMPLANT
WATER STERILE IRR 250ML POUR (IV SOLUTION) ×2 IMPLANT
WIPE NON LINTING 3.25X3.25 (MISCELLANEOUS) ×2 IMPLANT

## 2020-06-18 NOTE — Anesthesia Postprocedure Evaluation (Signed)
Anesthesia Post Note  Patient: [name]  Procedure(s) Performed: CATARACT EXTRACTION PHACO AND INTRAOCULAR LENS PLACEMENT (IOC) RIGHT PANOPTIX LENS 1.60 00:19.3 (Right Eye)     Patient location during evaluation: PACU Anesthesia Type: MAC Level of consciousness: awake and alert and oriented Pain management: satisfactory to patient Vital Signs Assessment: post-procedure vital signs reviewed and stable Respiratory status: spontaneous breathing, nonlabored ventilation and respiratory function stable Cardiovascular status: blood pressure returned to baseline and stable Postop Assessment: Adequate PO intake and No signs of nausea or vomiting Anesthetic complications: no   No complications documented.  Raliegh Ip

## 2020-06-18 NOTE — Op Note (Signed)
OPERATIVE NOTE  [name] [id number] 06/18/2020   PREOPERATIVE DIAGNOSIS:  Nuclear sclerotic cataract right eye.  H25.11   POSTOPERATIVE DIAGNOSIS:    Nuclear sclerotic cataract right eye.     PROCEDURE:  Phacoemusification with posterior chamber intraocular lens placement of the right eye   LENS:   Implant Name Type Inv. Item Serial No. Manufacturer Lot No. LRB No. Used Action  LENS IOL IQ PANOPTIX 20.0 - [id number]  LENS IOL IQ PANOPTIX 20.0 [id number] ALCON  Right 1 Implanted       Procedure(s): CATARACT EXTRACTION PHACO AND INTRAOCULAR LENS PLACEMENT (IOC) RIGHT PANOPTIX LENS 1.60 00:19.3 (Right)  TFNT00 +20.0   ULTRASOUND TIME: 0 minutes 19 seconds.  CDE 1.60   SURGEON:  Benay Pillow, MD, MPH  ANESTHESIOLOGIST: Anesthesiologist: Ronelle Nigh, MD CRNA: Silvana Newness, CRNA   ANESTHESIA:  Topical with tetracaine drops augmented with 1% preservative-free intracameral lidocaine.  ESTIMATED BLOOD LOSS: less than 1 mL.   COMPLICATIONS:  None.   DESCRIPTION OF PROCEDURE:  The patient was identified in the holding room and transported to the operating room and placed in the supine position under the operating microscope.  The right eye was identified as the operative eye and it was prepped and draped in the usual sterile ophthalmic fashion.   A 1.0 millimeter clear-corneal paracentesis was made at the 10:30 position. 0.5 ml of preservative-free 1% lidocaine with epinephrine was injected into the anterior chamber.  The anterior chamber was filled with Healon 5 viscoelastic.  A 2.4 millimeter keratome was used to make a near-clear corneal incision at the 8:00 position.  A curvilinear capsulorrhexis was made with a cystotome and capsulorrhexis forceps.  Balanced salt solution was used to hydrodissect and hydrodelineate the nucleus.   Phacoemulsification was then used in stop and chop fashion to remove the lens nucleus and epinucleus.  The remaining cortex was then removed  using the irrigation and aspiration handpiece. Healon was then placed into the capsular bag to distend it for lens placement.  A lens was then injected into the capsular bag.  The remaining viscoelastic was aspirated.   Wounds were hydrated with balanced salt solution.  The anterior chamber was inflated to a physiologic pressure with balanced salt solution.   Intracameral vigamox 0.1 mL undiluted was injected into the eye and a drop placed onto the ocular surface.  The lens was well centered.  No wound leaks were noted.  The patient was taken to the recovery room in stable condition without complications of anesthesia or surgery  Benay Pillow 06/18/2020, 8:27 AM

## 2020-06-18 NOTE — Anesthesia Preprocedure Evaluation (Signed)
Anesthesia Evaluation  Patient identified by MRN, date of birth, ID band Patient awake    Reviewed: Allergy & Precautions, H&P , NPO status , Patient's Chart, lab work & pertinent test results  Airway Mallampati: II  TM Distance: >3 FB Neck ROM: full    Dental no notable dental hx.    Pulmonary COPD, Current Smoker and Patient abstained from smoking.,    Pulmonary exam normal breath sounds clear to auscultation       Cardiovascular Normal cardiovascular exam Rhythm:regular Rate:Normal     Neuro/Psych    GI/Hepatic   Endo/Other    Renal/GU      Musculoskeletal   Abdominal   Peds  Hematology   Anesthesia Other Findings   Reproductive/Obstetrics                             Anesthesia Physical  Anesthesia Plan  ASA: II  Anesthesia Plan: MAC   Post-op Pain Management:    Induction:   PONV Risk Score and Plan: 2 and Treatment may vary due to age or medical condition, TIVA and Midazolam  Airway Management Planned:   Additional Equipment:   Intra-op Plan:   Post-operative Plan:   Informed Consent: I have reviewed the patients History and Physical, chart, labs and discussed the procedure including the risks, benefits and alternatives for the proposed anesthesia with the patient or authorized representative who has indicated his/her understanding and acceptance.     Dental Advisory Given  Plan Discussed with: CRNA  Anesthesia Plan Comments:         Anesthesia Quick Evaluation

## 2020-06-18 NOTE — Anesthesia Procedure Notes (Signed)
Procedure Name: MAC Date/Time: 06/18/2020 8:07 AM Performed by: Silvana Newness, CRNA Pre-anesthesia Checklist: Patient identified, Emergency Drugs available, Suction available, Patient being monitored and Timeout performed Patient Re-evaluated:Patient Re-evaluated prior to induction Oxygen Delivery Method: Nasal cannula Placement Confirmation: positive ETCO2

## 2020-06-18 NOTE — Transfer of Care (Signed)
Immediate Anesthesia Transfer of Care Note  Patient: [name]  Procedure(s) Performed: CATARACT EXTRACTION PHACO AND INTRAOCULAR LENS PLACEMENT (IOC) RIGHT PANOPTIX LENS 1.60 00:19.3 (Right Eye)  Patient Location: PACU  Anesthesia Type: MAC  Level of Consciousness: awake, alert  and patient cooperative  Airway and Oxygen Therapy: Patient Spontanous Breathing and Patient connected to supplemental oxygen  Post-op Assessment: Post-op Vital signs reviewed, Patient's Cardiovascular Status Stable, Respiratory Function Stable, Patent Airway and No signs of Nausea or vomiting  Post-op Vital Signs: Reviewed and stable  Complications: No complications documented.

## 2020-06-18 NOTE — H&P (Signed)
[facility]   Primary Care Physician:  Idelle Crouch, MD Ophthalmologist: Dr. Benay Pillow  Pre-Procedure History & Physical: HPI:  CARIGAN LISTER is a 70 y.o. female here for cataract surgery.   Past Medical History:  Diagnosis Date  . Anxiety   . Arthritis   . Diverticulosis, gastric   . Psoriasis   . Sleep apnea    cpap (05/28/20 - no longer uses)    Past Surgical History:  Procedure Laterality Date  . ABDOMINAL HYSTERECTOMY    . BACK SURGERY     plate at c4  . BREAST BIOPSY Right 1990's   benign  . CATARACT EXTRACTION W/PHACO Left 06/04/2020   Procedure: CATARACT EXTRACTION PHACO AND INTRAOCULAR LENS PLACEMENT ([place]) LEFT PANOPTIX TORIC LENS;  Surgeon: Eulogio Bear, MD;  Location: [facility];  Service: Ophthalmology;  Laterality: Left;  4.49 0:37.2  . CESAREAN SECTION     x2  . CHOLECYSTECTOMY    . MECKEL DIVERTICULUM EXCISION    . THUMB ARTHROSCOPY    . TRIGGER FINGER RELEASE Left 11/17/2016   Procedure: LEFT TRIGGER THUMB RELEASE;  Surgeon: Milly Jakob, MD;  Location: [place];  Service: Orthopedics;  Laterality: Left;    Prior to Admission medications   Medication Sig Start Date End Date Taking? Authorizing Provider  acetaminophen (TYLENOL) 325 MG tablet Take 2 tablets (650 mg total) by mouth every 6 (six) hours as needed for mild pain or moderate pain. 11/17/16  Yes Milly Jakob, MD  alendronate (FOSAMAX) 70 MG tablet Take by mouth. 12/25/15  Yes [provider]  amitriptyline (ELAVIL) 25 MG tablet Take by mouth. 11/26/15 06/18/20 Yes [provider]  Calcium Carbonate (CALCIUM 500 PO) Take by mouth daily.   Yes [provider]  Cyanocobalamin (VITAMIN B-12 PO) Take 1,000 mcg by mouth.   Yes [provider]  FLUoxetine (PROZAC) 40 MG capsule Take by mouth. 11/26/15  Yes [provider]  ibuprofen (ADVIL) 200 MG tablet Take 3 tablets (600 mg total) by mouth every 6 (six) hours as  needed for mild pain or moderate pain. 11/17/16  Yes Milly Jakob, MD  Misc Natural Products (OSTEO BI-FLEX/5-LOXIN ADVANCED PO) Take 2 tablets by mouth daily.    Yes [provider]  Multiple Vitamin (MULTI-VITAMIN) tablet Take by mouth.   Yes [provider]    Allergies as of 03/30/2020  . (No Known Allergies)    Family History  Problem Relation Age of Onset  . Breast cancer Mother 79    Social History   Socioeconomic History  . Marital status: Single    Spouse name: Not on file  . Number of children: Not on file  . Years of education: Not on file  . Highest education level: Not on file  Occupational History  . Not on file  Tobacco Use  . Smoking status: Current Every Day Smoker    Packs/day: 0.50    Years: 50.00    Pack years: 25.00    Types: Cigarettes  . Smokeless tobacco: Never Used  . Tobacco comment: started age 24  Vaping Use  . Vaping Use: Every day  . Substances: Nicotine  . Devices: Evod  Substance and Sexual Activity  . Alcohol use: No    Comment: Rare  . Drug use: No  . Sexual activity: Not on file  Other Topics Concern  . Not on file  Social History Narrative  . Not on file   Social Determinants of  Health   Financial Resource Strain: Not on file  Food Insecurity: Not on file  Transportation Needs: Not on file  Physical Activity: Not on file  Stress: Not on file  Social Connections: Not on file  Intimate Partner Violence: Not on file    Review of Systems: See HPI, otherwise negative ROS  Physical Exam: BP (!) 148/84   Pulse 73   Temp (!) 97.2 F (36.2 C) (Temporal)   Resp 16   Ht 5\' 5"  (1.651 m)   Wt 62.1 kg   SpO2 100%   BMI 22.80 kg/m  General:   Alert,  pleasant and cooperative in NAD Head:  Normocephalic and atraumatic. Respiratory:  Normal work of breathing.  Impression/Plan: [name] is here for cataract surgery.  Risks, benefits, limitations, and alternatives regarding cataract surgery have  been reviewed with the patient.  Questions have been answered.  All parties agreeable.   Benay Pillow, MD  06/18/2020, 7:54 AM

## 2020-06-19 ENCOUNTER — Encounter: Payer: Self-pay | Admitting: Ophthalmology

## 2020-07-15 IMAGING — CT CT CHEST W/O CM
2 of 4 series · 15 of 36 positions shown, 18 images · non-contrast
Comparison: 12/25/2016

CLINICAL DATA: Follow-up of chest CT 12/25/2016.  Current smoker.

EXAM:
CT CHEST WITHOUT CONTRAST
TECHNIQUE: Multidetector CT imaging of the chest was performed following the
standard protocol without IV contrast.

[Series 2: chest · axial · 0.62mm/px · z∈[-1303,-995]mm · 12 of 182 slices shown, 15 images (1 of 2)]
[im 14/182  mediastinal]
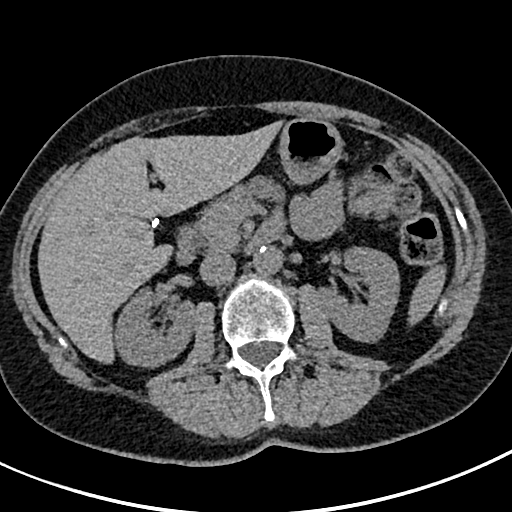
[im 14/182  lung]
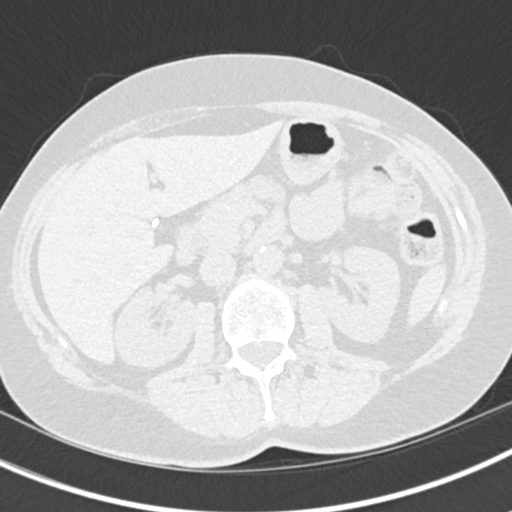
[im 28/182  lung]
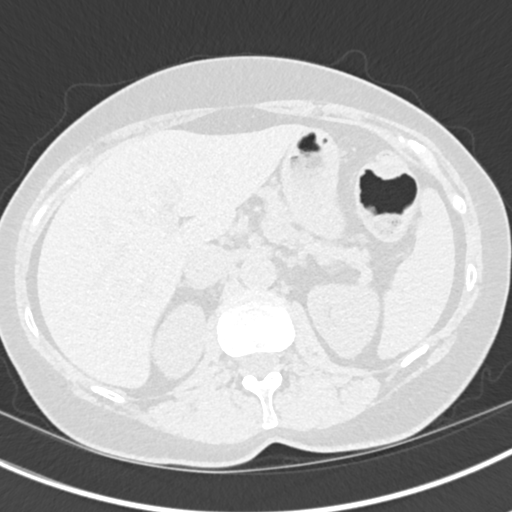
[im 42/182  lung]
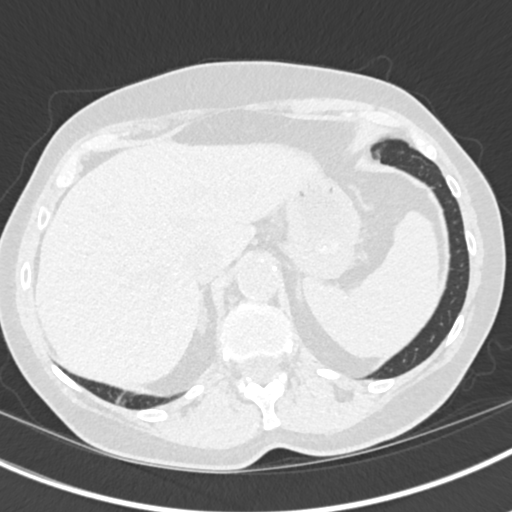
[im 56/182  lung]
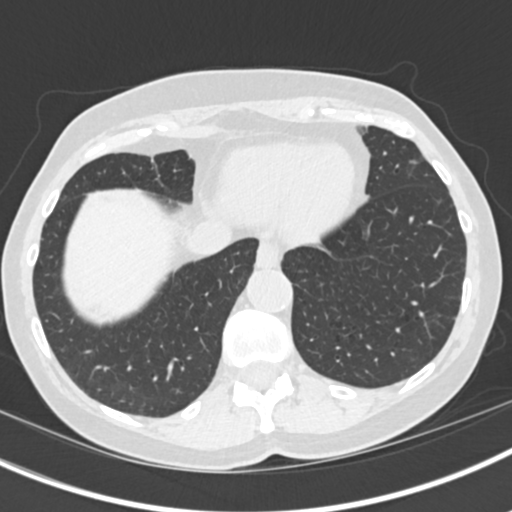
[im 70/182  mediastinal]
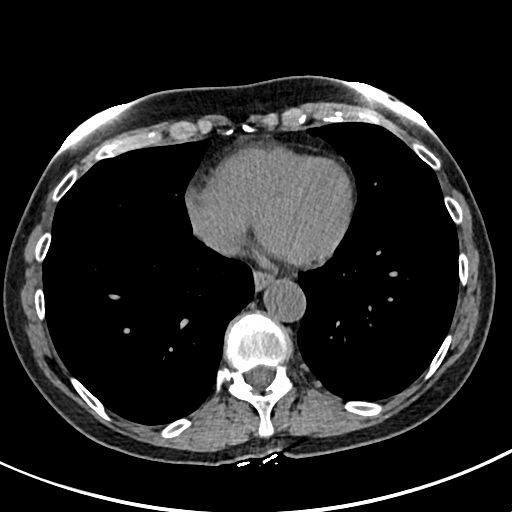
[im 70/182  lung]
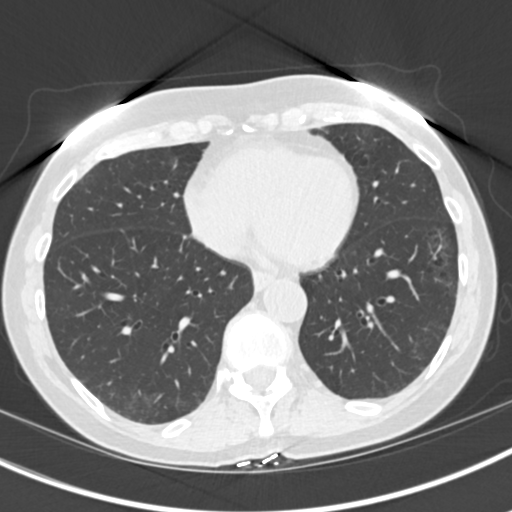
[im 84/182  lung]
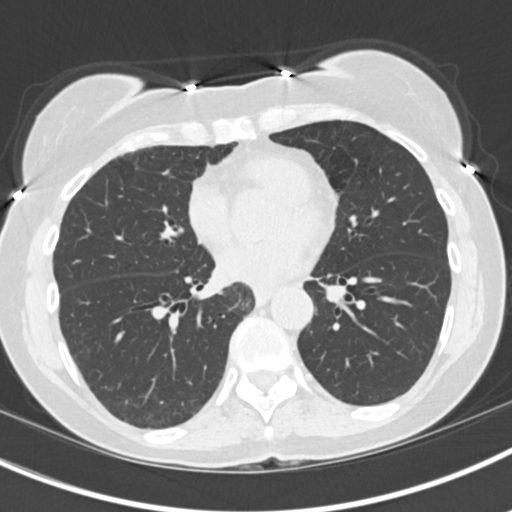
[im 98/182  lung]
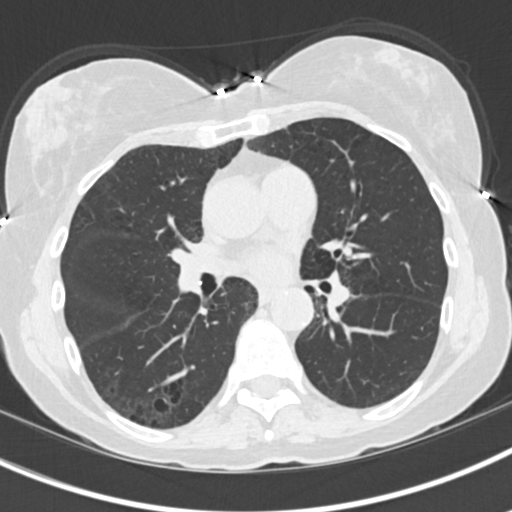
[im 112/182  lung]
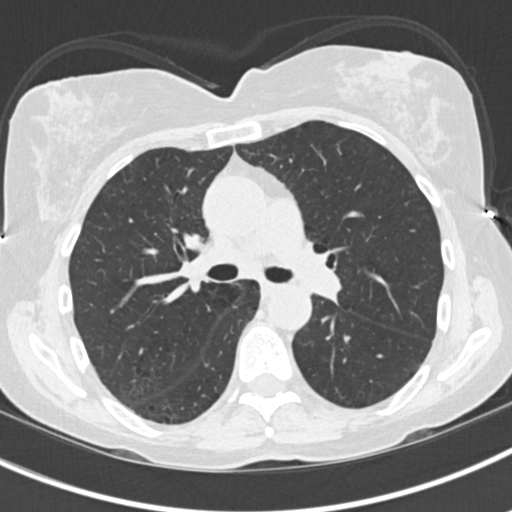
[im 126/182  mediastinal]
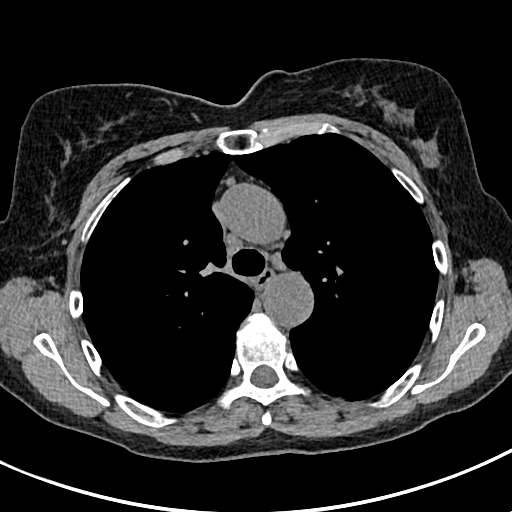
[im 126/182  lung]
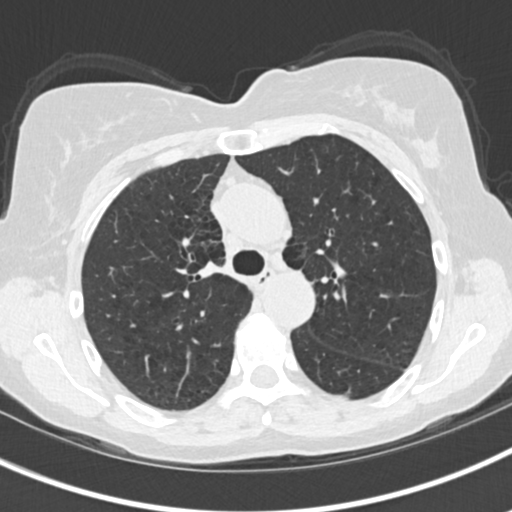
[im 140/182  lung]
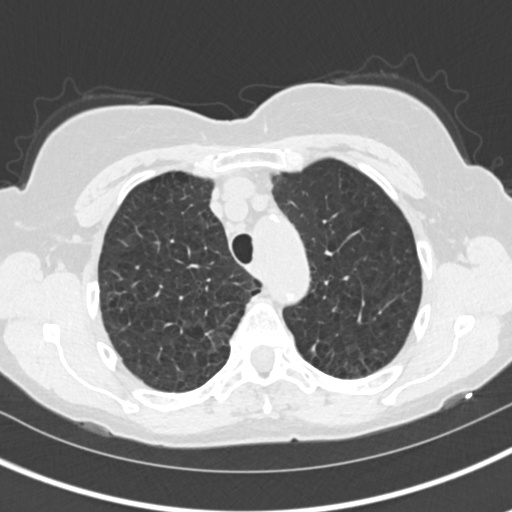
[im 154/182  lung]
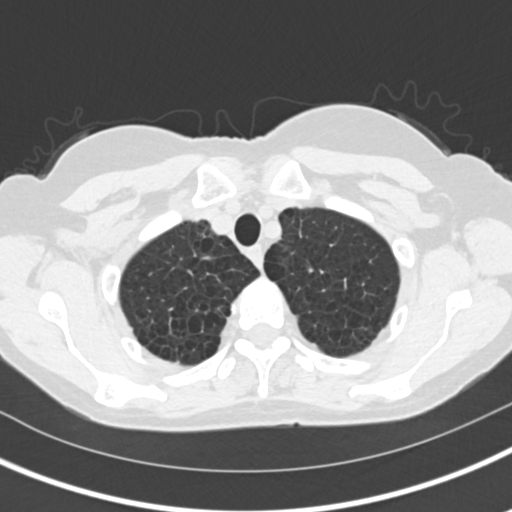
[im 168/182  lung]
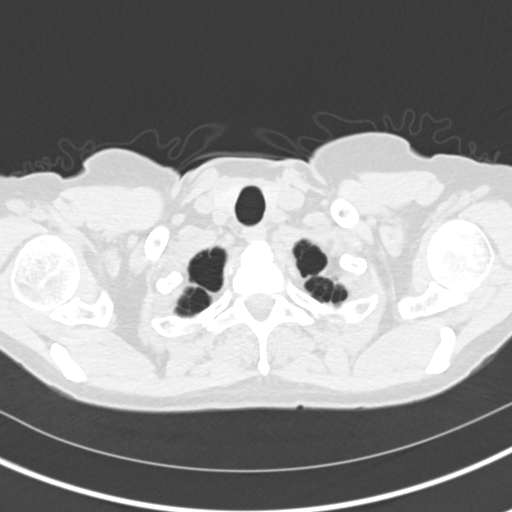

[Series 5: chest · coronal · 0.62mm/px · 3 of 136 slices shown (2 of 2)]
[im 28/136  lung]
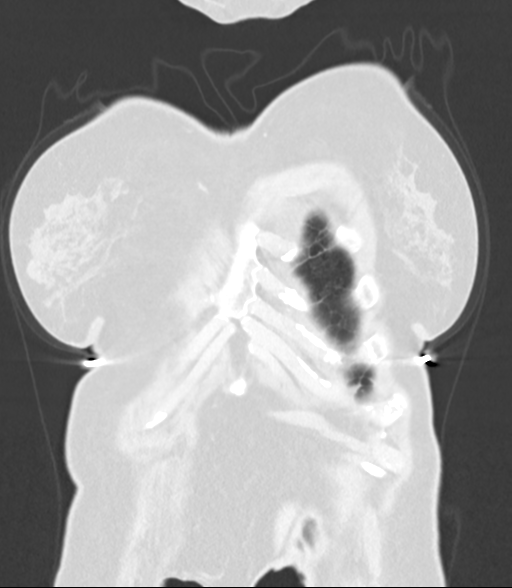
[im 55/136  lung]
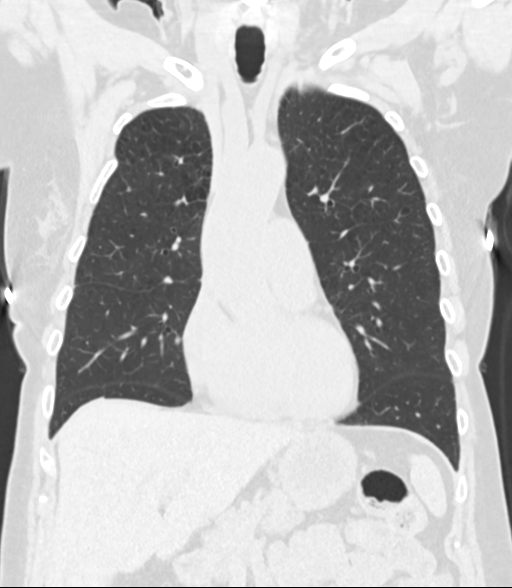
[im 82/136  lung]
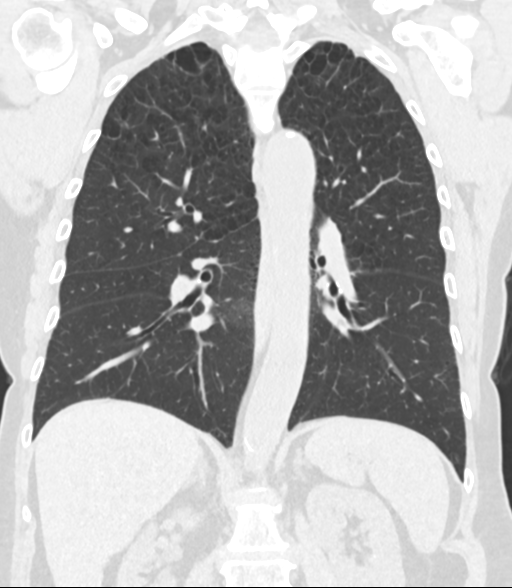

[15 of 36 positions shown; findings below may reference images not displayed]

FINDINGS: Cardiovascular: Normal heart size. No pericardial effusion. Mild
calcific atherosclerotic disease of the coronary arteries and aorta.

Mediastinum/Nodes: Small calcified right hilar lymph node.

Lungs/Pleura: Marked upper lobe predominant emphysema. Mild
hyperinflation. Slightly spiculated soft tissue nodule with laminal
calcifications in the left upper lobe measures 9.7 mm, image 40/182,
sequence 3. Similar in appearance partially calcified soft tissue
nodule in the superior segment of the left lower lobe measures
mm, image 80/182, sequence 3. Right lower lobe partially calcified
3. Circumscribed perifissural soft tissue nodule in the superior
segment of the right lower lobe measures 3 mm, image 70/182.

Upper Abdomen: No acute abnormalities. Postcholecystectomy. Calcific
atherosclerotic disease of the aorta.

Musculoskeletal: No chest wall mass or suspicious bone lesions
identified.
IMPRESSION: Severe lung emphysema.

Several bilateral partially calcified soft tissue nodules, the
dominant of which in the left upper lobe measures 9 mm. Non-contrast
chest CT at 3-6 months is recommended. If the nodules are stable at
time of repeat CT, then future CT at 18-24 months (from today's
scan) is considered optional for low-risk patients, but is
recommended for high-risk patients. This recommendation follows the
consensus statement: Guidelines for Management of Incidental
Pulmonary Nodules Detected on CT Images: From the [HOSPITAL]

Aortic Atherosclerosis (CS2Y7-P8H.H) and Emphysema (CS2Y7-C68.8).

## 2021-01-16 ENCOUNTER — Other Ambulatory Visit: Payer: Self-pay | Admitting: Internal Medicine

## 2021-01-16 DIAGNOSIS — Z1231 Encounter for screening mammogram for malignant neoplasm of breast: Secondary | ICD-10-CM

## 2021-03-11 ENCOUNTER — Other Ambulatory Visit: Payer: Self-pay

## 2021-03-11 ENCOUNTER — Ambulatory Visit
Admission: RE | Admit: 2021-03-11 | Discharge: 2021-03-11 | Disposition: A | Discharge status: Home / Self Care | Payer: Medicare HMO | Source: Ambulatory Visit | Attending: Internal Medicine | Admitting: Internal Medicine

## 2021-03-11 DIAGNOSIS — Z1231 Encounter for screening mammogram for malignant neoplasm of breast: Secondary | ICD-10-CM | POA: Diagnosis not present

## 2022-01-17 ENCOUNTER — Other Ambulatory Visit: Payer: Self-pay | Admitting: Internal Medicine

## 2022-01-17 DIAGNOSIS — Z1231 Encounter for screening mammogram for malignant neoplasm of breast: Secondary | ICD-10-CM

## 2022-01-20 ENCOUNTER — Other Ambulatory Visit: Payer: Self-pay | Admitting: Internal Medicine

## 2022-01-20 DIAGNOSIS — R0781 Pleurodynia: Secondary | ICD-10-CM

## 2022-01-20 DIAGNOSIS — Z0000 Encounter for general adult medical examination without abnormal findings: Secondary | ICD-10-CM

## 2022-01-23 ENCOUNTER — Inpatient Hospital Stay: Admission: RE | Admit: 2022-01-23 | Payer: Medicare HMO | Source: Ambulatory Visit

## 2022-01-24 ENCOUNTER — Ambulatory Visit
Admission: RE | Admit: 2022-01-24 | Discharge: 2022-01-24 | Disposition: A | Discharge status: Home / Self Care | Payer: Medicare HMO | Source: Ambulatory Visit | Attending: Internal Medicine | Admitting: Internal Medicine

## 2022-01-24 DIAGNOSIS — Z0000 Encounter for general adult medical examination without abnormal findings: Secondary | ICD-10-CM

## 2022-01-24 DIAGNOSIS — R0781 Pleurodynia: Secondary | ICD-10-CM

## 2022-03-18 ENCOUNTER — Ambulatory Visit
Admission: RE | Admit: 2022-03-18 | Discharge: 2022-03-18 | Disposition: A | Discharge status: Home / Self Care | Payer: Medicare HMO | Source: Ambulatory Visit | Attending: Internal Medicine | Admitting: Internal Medicine

## 2022-03-18 DIAGNOSIS — Z1231 Encounter for screening mammogram for malignant neoplasm of breast: Secondary | ICD-10-CM | POA: Insufficient documentation

## 2023-01-26 ENCOUNTER — Other Ambulatory Visit: Payer: Self-pay | Admitting: Internal Medicine

## 2023-01-26 DIAGNOSIS — Z1231 Encounter for screening mammogram for malignant neoplasm of breast: Secondary | ICD-10-CM

## 2023-02-04 LAB — COLOGUARD: COLOGUARD: NEGATIVE

## 2023-02-04 LAB — EXTERNAL GENERIC LAB PROCEDURE: COLOGUARD: NEGATIVE

## 2023-03-20 ENCOUNTER — Ambulatory Visit
Admission: RE | Admit: 2023-03-20 | Discharge: 2023-03-20 | Disposition: A | Discharge status: Home / Self Care | Payer: Medicare HMO | Source: Ambulatory Visit | Attending: Internal Medicine | Admitting: Internal Medicine

## 2023-03-20 DIAGNOSIS — Z1231 Encounter for screening mammogram for malignant neoplasm of breast: Secondary | ICD-10-CM | POA: Diagnosis present

## 2023-05-25 DIAGNOSIS — M810 Age-related osteoporosis without current pathological fracture: Secondary | ICD-10-CM | POA: Diagnosis not present

## 2023-10-08 DIAGNOSIS — H26493 Other secondary cataract, bilateral: Secondary | ICD-10-CM | POA: Diagnosis not present

## 2023-10-08 DIAGNOSIS — Z961 Presence of intraocular lens: Secondary | ICD-10-CM | POA: Diagnosis not present

## 2023-10-08 DIAGNOSIS — H04123 Dry eye syndrome of bilateral lacrimal glands: Secondary | ICD-10-CM | POA: Diagnosis not present

## 2023-10-08 DIAGNOSIS — H43813 Vitreous degeneration, bilateral: Secondary | ICD-10-CM | POA: Diagnosis not present

## 2023-11-24 DIAGNOSIS — M810 Age-related osteoporosis without current pathological fracture: Secondary | ICD-10-CM | POA: Diagnosis not present

## 2024-01-29 ENCOUNTER — Other Ambulatory Visit: Payer: Self-pay | Admitting: Internal Medicine

## 2024-01-29 DIAGNOSIS — J431 Panlobular emphysema: Secondary | ICD-10-CM | POA: Diagnosis not present

## 2024-01-29 DIAGNOSIS — Z1231 Encounter for screening mammogram for malignant neoplasm of breast: Secondary | ICD-10-CM | POA: Diagnosis not present

## 2024-01-29 DIAGNOSIS — R198 Other specified symptoms and signs involving the digestive system and abdomen: Secondary | ICD-10-CM | POA: Diagnosis not present

## 2024-01-29 DIAGNOSIS — M810 Age-related osteoporosis without current pathological fracture: Secondary | ICD-10-CM | POA: Diagnosis not present

## 2024-01-29 DIAGNOSIS — N1831 Chronic kidney disease, stage 3a: Secondary | ICD-10-CM | POA: Diagnosis not present

## 2024-01-29 DIAGNOSIS — Z13220 Encounter for screening for lipoid disorders: Secondary | ICD-10-CM | POA: Diagnosis not present

## 2024-01-29 DIAGNOSIS — Z0000 Encounter for general adult medical examination without abnormal findings: Secondary | ICD-10-CM | POA: Diagnosis not present

## 2024-01-29 DIAGNOSIS — Z1331 Encounter for screening for depression: Secondary | ICD-10-CM | POA: Diagnosis not present

## 2024-01-29 DIAGNOSIS — Z720 Tobacco use: Secondary | ICD-10-CM | POA: Diagnosis not present

## 2024-01-29 DIAGNOSIS — Z79899 Other long term (current) drug therapy: Secondary | ICD-10-CM | POA: Diagnosis not present

## 2024-01-29 DIAGNOSIS — I10 Essential (primary) hypertension: Secondary | ICD-10-CM | POA: Diagnosis not present

## 2024-02-22 ENCOUNTER — Other Ambulatory Visit: Payer: Self-pay | Admitting: Internal Medicine

## 2024-02-22 ENCOUNTER — Ambulatory Visit
Admission: RE | Admit: 2024-02-22 | Discharge: 2024-02-22 | Disposition: A | Discharge status: Home / Self Care | Source: Ambulatory Visit | Attending: Internal Medicine | Admitting: Internal Medicine

## 2024-02-22 DIAGNOSIS — N644 Mastodynia: Secondary | ICD-10-CM

## 2024-02-22 DIAGNOSIS — R590 Localized enlarged lymph nodes: Secondary | ICD-10-CM | POA: Insufficient documentation

## 2024-02-22 DIAGNOSIS — J439 Emphysema, unspecified: Secondary | ICD-10-CM | POA: Diagnosis not present

## 2024-03-28 DIAGNOSIS — Z13220 Encounter for screening for lipoid disorders: Secondary | ICD-10-CM | POA: Diagnosis not present

## 2024-03-28 DIAGNOSIS — M8588 Other specified disorders of bone density and structure, other site: Secondary | ICD-10-CM | POA: Diagnosis not present

## 2024-03-28 DIAGNOSIS — M810 Age-related osteoporosis without current pathological fracture: Secondary | ICD-10-CM | POA: Diagnosis not present

## 2024-03-28 DIAGNOSIS — Z79899 Other long term (current) drug therapy: Secondary | ICD-10-CM | POA: Diagnosis not present

## 2024-03-28 DIAGNOSIS — I10 Essential (primary) hypertension: Secondary | ICD-10-CM | POA: Diagnosis not present

## 2024-03-30 ENCOUNTER — Other Ambulatory Visit: Payer: Self-pay | Admitting: Internal Medicine

## 2024-03-30 ENCOUNTER — Ambulatory Visit
Admission: RE | Admit: 2024-03-30 | Discharge: 2024-03-30 | Disposition: A | Discharge status: Home / Self Care | Source: Ambulatory Visit | Attending: Internal Medicine | Admitting: Internal Medicine

## 2024-03-30 DIAGNOSIS — R92333 Mammographic heterogeneous density, bilateral breasts: Secondary | ICD-10-CM | POA: Diagnosis not present

## 2024-03-30 DIAGNOSIS — N644 Mastodynia: Secondary | ICD-10-CM

## 2024-03-31 DIAGNOSIS — M810 Age-related osteoporosis without current pathological fracture: Secondary | ICD-10-CM | POA: Diagnosis not present

## 2024-03-31 DIAGNOSIS — F17200 Nicotine dependence, unspecified, uncomplicated: Secondary | ICD-10-CM | POA: Diagnosis not present
